# Patient Record
Sex: Male | Born: 2000
Health system: Southern US, Community
[De-identification: ages and names within clinical notes are randomized; demographics above are authoritative.]

## PROBLEM LIST (undated history)

## (undated) DIAGNOSIS — R000 Tachycardia, unspecified: Secondary | ICD-10-CM

## (undated) DIAGNOSIS — Z9889 Other specified postprocedural states: Secondary | ICD-10-CM

## (undated) DIAGNOSIS — F909 Attention-deficit hyperactivity disorder, unspecified type: Secondary | ICD-10-CM

## (undated) DIAGNOSIS — R112 Nausea with vomiting, unspecified: Secondary | ICD-10-CM

## (undated) DIAGNOSIS — F82 Specific developmental disorder of motor function: Secondary | ICD-10-CM

## (undated) DIAGNOSIS — M93003 Unspecified slipped upper femoral epiphysis (nontraumatic), unspecified hip: Secondary | ICD-10-CM

## (undated) DIAGNOSIS — F419 Anxiety disorder, unspecified: Secondary | ICD-10-CM

## (undated) DIAGNOSIS — I81 Portal vein thrombosis: Secondary | ICD-10-CM

## (undated) DIAGNOSIS — Q388 Other congenital malformations of pharynx: Secondary | ICD-10-CM

## (undated) DIAGNOSIS — D649 Anemia, unspecified: Secondary | ICD-10-CM

## (undated) DIAGNOSIS — F809 Developmental disorder of speech and language, unspecified: Secondary | ICD-10-CM

## (undated) DIAGNOSIS — J189 Pneumonia, unspecified organism: Secondary | ICD-10-CM

## (undated) DIAGNOSIS — U071 COVID-19: Secondary | ICD-10-CM

## (undated) DIAGNOSIS — E119 Type 2 diabetes mellitus without complications: Secondary | ICD-10-CM

## (undated) HISTORY — DX: Tachycardia, unspecified: R00.0

## (undated) HISTORY — PX: HIP SURGERY: SHX245

## (undated) HISTORY — DX: Attention-deficit hyperactivity disorder, unspecified type: F90.9

## (undated) HISTORY — DX: Other congenital malformations of pharynx: Q38.8

## (undated) HISTORY — PX: PHARYNGEAL FLAP REVISION: SHX2234

## (undated) HISTORY — DX: Specific developmental disorder of motor function: F82

## (undated) HISTORY — DX: Developmental disorder of speech and language, unspecified: F80.9

## (undated) HISTORY — PX: APPENDECTOMY: SHX54

## (undated) HISTORY — PX: PHARYNGEAL FLAP: SHX2233

---
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
## 2021-06-12 ENCOUNTER — Emergency Department (HOSPITAL_COMMUNITY): Payer: BC Managed Care – PPO

## 2021-06-12 ENCOUNTER — Encounter (HOSPITAL_COMMUNITY): Payer: Self-pay | Admitting: Emergency Medicine

## 2021-06-12 ENCOUNTER — Inpatient Hospital Stay (HOSPITAL_COMMUNITY)
Admission: EM | Admit: 2021-06-12 | Discharge: 2021-06-15 | DRG: 871 | Disposition: A | Discharge status: Home / Self Care | Payer: BC Managed Care – PPO | Attending: Family Medicine | Admitting: Family Medicine

## 2021-06-12 DIAGNOSIS — F82 Specific developmental disorder of motor function: Secondary | ICD-10-CM | POA: Diagnosis present

## 2021-06-12 DIAGNOSIS — E230 Hypopituitarism: Secondary | ICD-10-CM | POA: Diagnosis present

## 2021-06-12 DIAGNOSIS — K3532 Acute appendicitis with perforation and localized peritonitis, without abscess: Secondary | ICD-10-CM | POA: Diagnosis present

## 2021-06-12 DIAGNOSIS — I81 Portal vein thrombosis: Secondary | ICD-10-CM | POA: Diagnosis not present

## 2021-06-12 DIAGNOSIS — R000 Tachycardia, unspecified: Secondary | ICD-10-CM | POA: Diagnosis present

## 2021-06-12 DIAGNOSIS — E876 Hypokalemia: Secondary | ICD-10-CM | POA: Diagnosis not present

## 2021-06-12 DIAGNOSIS — Z888 Allergy status to other drugs, medicaments and biological substances status: Secondary | ICD-10-CM

## 2021-06-12 DIAGNOSIS — Z886 Allergy status to analgesic agent status: Secondary | ICD-10-CM

## 2021-06-12 DIAGNOSIS — Z20822 Contact with and (suspected) exposure to covid-19: Secondary | ICD-10-CM | POA: Diagnosis present

## 2021-06-12 DIAGNOSIS — R162 Hepatomegaly with splenomegaly, not elsewhere classified: Secondary | ICD-10-CM | POA: Diagnosis present

## 2021-06-12 DIAGNOSIS — K381 Appendicular concretions: Secondary | ICD-10-CM | POA: Diagnosis present

## 2021-06-12 DIAGNOSIS — R739 Hyperglycemia, unspecified: Secondary | ICD-10-CM | POA: Diagnosis present

## 2021-06-12 DIAGNOSIS — Z68.41 Body mass index (BMI) pediatric, greater than or equal to 95th percentile for age: Secondary | ICD-10-CM

## 2021-06-12 DIAGNOSIS — F909 Attention-deficit hyperactivity disorder, unspecified type: Secondary | ICD-10-CM | POA: Diagnosis present

## 2021-06-12 DIAGNOSIS — F809 Developmental disorder of speech and language, unspecified: Secondary | ICD-10-CM | POA: Diagnosis present

## 2021-06-12 DIAGNOSIS — A419 Sepsis, unspecified organism: Secondary | ICD-10-CM | POA: Diagnosis not present

## 2021-06-12 DIAGNOSIS — T368X5A Adverse effect of other systemic antibiotics, initial encounter: Secondary | ICD-10-CM | POA: Diagnosis not present

## 2021-06-12 DIAGNOSIS — D649 Anemia, unspecified: Secondary | ICD-10-CM | POA: Diagnosis not present

## 2021-06-12 DIAGNOSIS — Z8616 Personal history of COVID-19: Secondary | ICD-10-CM

## 2021-06-12 DIAGNOSIS — K760 Fatty (change of) liver, not elsewhere classified: Secondary | ICD-10-CM | POA: Diagnosis present

## 2021-06-12 DIAGNOSIS — Z79899 Other long term (current) drug therapy: Secondary | ICD-10-CM

## 2021-06-12 DIAGNOSIS — E669 Obesity, unspecified: Secondary | ICD-10-CM | POA: Diagnosis present

## 2021-06-12 LAB — URINALYSIS, ROUTINE W REFLEX MICROSCOPIC
Bacteria, UA: NONE SEEN
Bilirubin Urine: NEGATIVE
Glucose, UA: NEGATIVE mg/dL
Ketones, ur: NEGATIVE mg/dL
Leukocytes,Ua: NEGATIVE
Nitrite: NEGATIVE
Protein, ur: NEGATIVE mg/dL
Specific Gravity, Urine: 1.016 (ref 1.005–1.030)
pH: 5 (ref 5.0–8.0)

## 2021-06-12 LAB — D-DIMER, QUANTITATIVE: D-Dimer, Quant: 8.05 ug/mL-FEU — ABNORMAL HIGH (ref 0.00–0.50)

## 2021-06-12 LAB — CBC WITH DIFFERENTIAL/PLATELET
Abs Immature Granulocytes: 0 10*3/uL (ref 0.00–0.07)
Basophils Absolute: 0 10*3/uL (ref 0.0–0.1)
Basophils Relative: 0 %
Eosinophils Absolute: 0 10*3/uL (ref 0.0–0.5)
Eosinophils Relative: 0 %
HCT: 35.2 % — ABNORMAL LOW (ref 39.0–52.0)
Hemoglobin: 11 g/dL — ABNORMAL LOW (ref 13.0–17.0)
Lymphocytes Relative: 8 %
Lymphs Abs: 1.2 10*3/uL (ref 0.7–4.0)
MCH: 26.6 pg (ref 26.0–34.0)
MCHC: 31.3 g/dL (ref 30.0–36.0)
MCV: 85.2 fL (ref 80.0–100.0)
Monocytes Absolute: 0.6 10*3/uL (ref 0.1–1.0)
Monocytes Relative: 4 %
Neutro Abs: 12.8 10*3/uL — ABNORMAL HIGH (ref 1.7–7.7)
Neutrophils Relative %: 88 %
Platelets: 199 10*3/uL (ref 150–400)
RBC: 4.13 MIL/uL — ABNORMAL LOW (ref 4.22–5.81)
RDW: 14.6 % (ref 11.5–15.5)
WBC: 14.6 10*3/uL — ABNORMAL HIGH (ref 4.0–10.5)
nRBC: 0 % (ref 0.0–0.2)
nRBC: 0 /100 WBC

## 2021-06-12 LAB — COMPREHENSIVE METABOLIC PANEL
ALT: 114 U/L — ABNORMAL HIGH (ref 0–44)
AST: 74 U/L — ABNORMAL HIGH (ref 15–41)
Albumin: 2.3 g/dL — ABNORMAL LOW (ref 3.5–5.0)
Alkaline Phosphatase: 132 U/L — ABNORMAL HIGH (ref 38–126)
Anion gap: 7 (ref 5–15)
BUN: 10 mg/dL (ref 6–20)
CO2: 22 mmol/L (ref 22–32)
Calcium: 7.6 mg/dL — ABNORMAL LOW (ref 8.9–10.3)
Chloride: 112 mmol/L — ABNORMAL HIGH (ref 98–111)
Creatinine, Ser: 0.69 mg/dL (ref 0.61–1.24)
GFR, Estimated: >60 mL/min (ref >60)
Glucose, Bld: 128 mg/dL — ABNORMAL HIGH (ref 70–99)
Potassium: 3.2 mmol/L — ABNORMAL LOW (ref 3.5–5.1)
Sodium: 141 mmol/L (ref 135–145)
Total Bilirubin: 0.9 mg/dL (ref 0.3–1.2)
Total Protein: 5.3 g/dL — ABNORMAL LOW (ref 6.5–8.1)

## 2021-06-12 LAB — RESP PANEL BY RT-PCR (FLU A&B, COVID) ARPGX2
Influenza A by PCR: NEGATIVE
Influenza B by PCR: NEGATIVE
SARS Coronavirus 2 by RT PCR: NEGATIVE

## 2021-06-12 LAB — LACTIC ACID, PLASMA
Lactic Acid, Venous: 2.5 mmol/L (ref 0.5–1.9)
Lactic Acid, Venous: 1.5 mmol/L (ref 0.5–1.9)

## 2021-06-12 LAB — TSH: TSH: 1.977 u[IU]/mL (ref 0.350–4.500)

## 2021-06-12 MED ORDER — POTASSIUM CHLORIDE 20 MEQ PO PACK
40.0000 meq | PACK | Freq: Once | ORAL | Status: AC
  Administered 2021-06-12: 40 meq via ORAL
  Filled 2021-06-12: qty 2

## 2021-06-12 MED ORDER — IOHEXOL 350 MG/ML SOLN
100.0000 mL | Freq: Once | INTRAVENOUS | Status: AC | PRN
  Administered 2021-06-12: 100 mL via INTRAVENOUS

## 2021-06-12 MED ORDER — METRONIDAZOLE 500 MG/100ML IV SOLN
500.0000 mg | Freq: Three times a day (TID) | INTRAVENOUS | Status: DC
  Administered 2021-06-12 – 2021-06-14 (×5): 500 mg via INTRAVENOUS
  Filled 2021-06-12 (×5): qty 100

## 2021-06-12 MED ORDER — HEPARIN BOLUS VIA INFUSION
3000.0000 [IU] | Freq: Once | INTRAVENOUS | Status: AC
  Administered 2021-06-12: 3000 [IU] via INTRAVENOUS
  Filled 2021-06-12: qty 3000

## 2021-06-12 MED ORDER — LACTATED RINGERS IV BOLUS
2000.0000 mL | Freq: Once | INTRAVENOUS | Status: AC
  Administered 2021-06-12: 16:00:00 2000 mL via INTRAVENOUS

## 2021-06-12 MED ORDER — HEPARIN (PORCINE) 25000 UT/250ML-% IV SOLN
2200.0000 [IU]/h | INTRAVENOUS | Status: DC
  Administered 2021-06-12: 1300 [IU]/h via INTRAVENOUS
  Administered 2021-06-13: 1500 [IU]/h via INTRAVENOUS
  Administered 2021-06-14: 2000 [IU]/h via INTRAVENOUS
  Filled 2021-06-12 (×3): qty 250

## 2021-06-12 MED ORDER — LACTATED RINGERS IV SOLN: INTRAVENOUS | Status: DC

## 2021-06-12 MED ORDER — VANCOMYCIN HCL 10 G IV SOLR
2000.0000 mg | Freq: Once | INTRAVENOUS | Status: AC
  Administered 2021-06-12: 2000 mg via INTRAVENOUS
  Filled 2021-06-12 (×2): qty 2000

## 2021-06-12 MED ORDER — SODIUM CHLORIDE 0.9 % IV SOLN
2.0000 g | INTRAVENOUS | Status: DC
  Administered 2021-06-13 – 2021-06-14 (×2): 2 g via INTRAVENOUS
  Filled 2021-06-12: qty 2
  Filled 2021-06-12: qty 20

## 2021-06-12 MED ORDER — PIPERACILLIN-TAZOBACTAM 3.375 G IVPB 30 MIN
3.3750 g | Freq: Once | INTRAVENOUS | Status: AC
  Administered 2021-06-12: 3.375 g via INTRAVENOUS
  Filled 2021-06-12: qty 50

## 2021-06-12 MED ORDER — PIPERACILLIN-TAZOBACTAM 3.375 G IVPB: 3.3750 g | Freq: Three times a day (TID) | INTRAVENOUS | Status: DC

## 2021-06-12 NOTE — Progress Notes (Addendum)
ANTICOAGULATION CONSULT NOTE - Initial Consult  Pharmacy Consult for Heparin  Indication: Non-occlusive portal vein thrombosis   Allergies  Allergen Reactions   Vancomycin Swelling    Lip swelling, red and puffy, burning in groin/genital region   Guaifenesin & Derivatives Hives    Reaction was to either guaifenesin or dayquil   Lomotil [Diphenoxylate-Atropine] Hives    Possible reaction to Lomotil - 07/25/2020   Chlorhexidine Rash   Dayquil [Pseudoephedrine-Apap-Dm] Hives    Reaction was to either guaifenesin or dayquil    Tylenol [Acetaminophen] Hives    Patient Measurements: Height: 5\' 9"  (175.3 cm) Weight: 106.2 kg (234 lb 2.1 oz) IBW/kg (Calculated) : 70.7  Vital Signs: Temp: 98.6 F (37 C) (07/01 2244) Temp Source: Oral (07/01 2244) BP: 128/86 (07/01 2244) Pulse Rate: 91 (07/01 2244)  Labs: Recent Labs    06/12/21 1457  HGB 11.0*  HCT 35.2*  PLT 199  CREATININE 0.69    Estimated Creatinine Clearance: 178.3 mL/min (by C-G formula based on SCr of 0.69 mg/dL).   Medical History: Past Medical History:  Diagnosis Date   ADHD (attention deficit hyperactivity disorder)    Gross motor development delay    PONV (postoperative nausea and vomiting)    Speech delay    Velopharyngeal insufficiency, congenital     Assessment: 20 y/o M who presented to the ED with 2 weeks of intermittent N/V, chills, and tachycardia. Found to have perforated appendicitis. Also noted to have elevated D-dimer and Non-occlusive portal vein thrombosis. Starting heparin per pharmacy. Hgb 11. Renal function good.   Goal of Therapy:  Heparin level 0.3-0.7 units/ml Monitor platelets by anticoagulation protocol: Yes   Plan:  Heparin 3000 units BOLUS (reduced bolus with perforated appendix) Start heparin drip at 1300 units/hr 0800 Heparin level Daily CBC/Heparin level Monitor for bleeding  Narda Bonds, PharmD, BCPS Clinical Pharmacist Phone: [phone]

## 2021-06-12 NOTE — ED Provider Notes (Addendum)
I received this patient in signout from Dr. Zenia Resides.  Briefly, he had presented with 2 weeks of intermittent fevers, initially had nausea, vomiting, and diarrhea but no associated abdominal pain.  EMS noted severe tachycardia and initially gave adenosine but patient later found to have sinus tachycardia likely due to fever.  At time of signout, fluids had been ordered and awaiting lab work.  Labs show initial WBC 14.6, lactate 2.5, hemoglobin 11, negative COVID-19, normal creatinine, AST 74, ALT 114, normal bilirubin, D-dimer 8.  Based on elevated lactate and WBC count, gave broad-spectrum antibiotics including vancomycin and Zosyn for unknown source.  Obtain CTA of chest to evaluate for PE and also obtain CT of abdomen pelvis to evaluate for source of infection.  CTA negative for PE but CT of abdomen shows likely perforated appendicitis with extraluminal air and fluid with surrounding inflammatory changes.  He also has nonocclusive thrombus in portal vein likely secondary to inflammation.  Findings discussed with radiologist.  Consulted general surgery and discussed with Dr. Rosendo Gros who evaluated pt, has recommended abx for now. Will be delayed surgical intervention. Recommended medicine admission due to portal vein thrombus and potential need for anticoagulation.  Discussed admission with family medicine teaching service.  CRITICAL CARE Performed by: Wenda Overland [place]   Total critical care time: 45 minutes  Critical care time was exclusive of separately billable procedures and treating other patients.  Critical care was necessary to treat or prevent imminent or life-threatening deterioration.  Critical care was time spent personally by me on the following activities: development of treatment plan with patient and/or surrogate as well as nursing, discussions with consultants, evaluation of patient's response to treatment, examination of patient, obtaining history from patient or surrogate,  ordering and performing treatments and interventions, ordering and review of laboratory studies, ordering and review of radiographic studies, pulse oximetry and re-evaluation of patient's condition.    Marietta Sikkema, Wenda Overland, MD 06/12/21 1954    Rex Kras, Wenda Overland, MD 06/12/21 2212

## 2021-06-12 NOTE — ED Provider Notes (Signed)
[facility] Provider Note   CSN: [id number] Arrival date & time: 06/12/21  1431     History Chief Complaint  Patient presents with   Tachycardia    Juan Valencia is a 20 y.o. male.  20 year old male who presents with 2 weeks of fever off and on without definitive source.  Does have a history of COVID-pneumonia in the past.  Last had increased temperature 2 days ago and was seen by his pediatrician the day before that.  Had negative COVID and mono at that time.  States that he has been more short of breath.  Patient did have a white count of 14,000.  He had an elevated sed rate as well 2.  While at home today, patient had increased weakness.  Was found to have a heart rate in the 170s.  Temperature was 103.  He took Motrin for this.  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   Obesity peds (BMI >=95 percentile) 04/10/2015   Delayed bone age 10/26/2014   Short stature 05/02/2012   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   Velopharyngeal insufficiency, congenital    ADHD (attention deficit hyperactivity disorder)    Speech delay    Gross motor development delay     Past Surgical History:  Procedure Laterality Date   HARDWARE REMOVAL Bilateral 07/26/2019   Procedure: removal of hardware bilateral hips;  Surgeon: Meredith Pel, MD;  Location: [facility];  Service: Orthopedics;  Laterality: Bilateral;   HIP PINNING,CANNULATED Bilateral 05/26/2017   Procedure: BILATERAL CANNULATED HIP PINNING;  Surgeon: Meredith Pel, MD;  Location: [facility];  Service: Orthopedics;  Laterality: Bilateral;   PHARYNGEAL FLAP     PHARYNGEAL FLAP REVISION         Family History  Problem Relation Age of Onset   Obesity Mother    Tall stature Father    Hypertension Father    Obesity Maternal Grandmother    Hypertension Maternal Grandmother    Diabetes Maternal Grandmother    Heart disease Paternal Grandmother    Diabetes Paternal Grandmother    Cancer Paternal Grandfather        lung    Social History   Tobacco Use   Smoking status: Never   Smokeless tobacco: Never  Vaping Use   Vaping Use: Never used  Substance Use Topics   Alcohol use: No   Drug use: No    Home Medications Prior to Admission medications   Medication Sig Start Date End Date Taking? Authorizing Provider  albuterol (VENTOLIN HFA) 108 (90 Base) MCG/ACT inhaler Inhale 2 puffs into the lungs every 6 (six) hours as needed for wheezing or shortness of breath. 07/30/20   Thurnell Lose, MD  bismuth subsalicylate (PEPTO BISMOL) 262 MG/15ML suspension Take 30 mLs by mouth every 6 (six) hours  as needed for diarrhea or loose stools.    [provider]    Allergies    Guaifenesin & derivatives, Lomotil [diphenoxylate-atropine], Dayquil [pseudoephedrine-apap-dm], and Tylenol [acetaminophen]  Review of Systems   Review of Systems  All other systems reviewed and are negative.  Physical Exam Updated Vital Signs BP 98/64 (BP Location: Right Arm)   Pulse (!) 167   Temp 99.3 F (37.4 C) (Oral)   Resp 16   SpO2 96%   Physical Exam Vitals and nursing note reviewed.  Constitutional:      General: He is not in acute distress.    Appearance: Normal appearance. He is well-developed. He is not toxic-appearing.  HENT:     Head:  Normocephalic and atraumatic.  Eyes:     General: Lids are normal.     Conjunctiva/sclera: Conjunctivae normal.     Pupils: Pupils are equal, round, and reactive to light.  Neck:     Thyroid: No thyroid mass.     Trachea: No tracheal deviation.  Cardiovascular:     Rate and Rhythm: Regular rhythm. Tachycardia present.     Heart sounds: Normal heart sounds. No murmur heard.   No gallop.  Pulmonary:     Effort: Pulmonary effort is normal. No respiratory distress.     Breath sounds: Normal breath sounds. No stridor. No decreased breath sounds, wheezing, rhonchi or rales.  Abdominal:     General: There is no distension.     Palpations: Abdomen is soft.     Tenderness: There is no abdominal tenderness. There is no rebound.  Musculoskeletal:        General: No tenderness. Normal range of motion.     Cervical back: Normal range of motion and neck supple.  Skin:    General: Skin is warm and dry.     Findings: No abrasion or rash.  Neurological:     Mental Status: He is alert and oriented to person, place, and time. Mental status is at baseline.     GCS: GCS eye subscore is 4. GCS verbal subscore is 5. GCS motor subscore is 6.     Cranial Nerves: Cranial nerves are intact. No cranial nerve deficit.     Sensory: No sensory deficit.     Motor: Motor function is intact.  Psychiatric:        Attention and Perception: Attention normal.        Speech: Speech normal.        Behavior: Behavior normal.    ED Results / Procedures / Treatments   Labs (all labs ordered are listed, but only abnormal results are displayed) Labs Reviewed  CULTURE, BLOOD (ROUTINE X 2)  CULTURE, BLOOD (ROUTINE X 2)  RESP PANEL BY RT-PCR (FLU A&B, COVID) ARPGX2  CBC WITH DIFFERENTIAL/PLATELET  COMPREHENSIVE METABOLIC PANEL  D-DIMER, QUANTITATIVE  LACTIC ACID, PLASMA  TSH    EKG EKG Interpretation  Date/Time:  Friday June 12 2021 14:38:24 EDT Ventricular Rate:  167 PR Interval:  110 QRS Duration: 88 QT  Interval:  314 QTC Calculation: 524 R Axis:   98 Text Interpretation: Sinus tachycardia Consider RVH w/ secondary repol abnormality Prolonged QT interval Confirmed by Lacretia Leigh ([id number]) on 06/12/2021 2:50:10 PM  Radiology No results found.  Procedures Procedures   Medications Ordered in ED Medications  lactated ringers bolus 2,000 mL (has no administration in time range)  lactated ringers infusion (has no administration in time range)    ED Course  I have reviewed the triage vital  signs and the nursing notes.  Pertinent labs & imaging results that were available during my care of the patient were reviewed by me and considered in my medical decision making (see chart for details).    MDM Rules/Calculators/A&P                         Patient appears to be in sinus tach at this time.  Will give 2 L of fluid and initiate septic work-up.  Labs chest x-ray pending.  Will sign out to Dr. Rex Kras Final Clinical Impression(s) / ED Diagnoses Final diagnoses:  None    Rx / DC Orders ED Discharge Orders     None        Lacretia Leigh, MD 06/12/21 1457

## 2021-06-12 NOTE — H&P (Addendum)
Juan Valencia Admission History and Physical Service Pager: [phone]  Patient name: Juan Valencia Medical record number: [id number] Date of birth: Nov 14, 2001 Age: 20 y.o. Gender: male  Primary Care Provider: Harrie Jeans, MD Consultants: General Surgery  Code Status: Full Code   Preferred Emergency Contact: Mylo Red, Mother, [phone]  Chief Complaint: emesis and fevers x2 weeks  Assessment and Plan: Juan Valencia is a 20 y.o. male presenting with vomiting found to have portal vein thrombosis and perforated appendicitis. PMH is significant for ADHD.   Perforated Appendicitis w/ calcification Patient presented after 2 weeks of intermittent fever, diarrhea, vomiting.  CT of his abdomen and pelvis showed perforated appendicitis with appendicolith.  Of note, patient has no tenderness on abdominal exam, no signs of peritoneal irritation.  White blood cell count elevated at 14.6.  Patient afebrile at the time of admission to ED.  Patient treated with 1 dose of vancomycin and 1 dose of Zosyn in ED.  Patient began to have symptoms of erythematous rash, lip edema, growing burning sensation following dose of vancomycin.  Patient's mother request that he no longer receive this medication and asked that he has multiple allergies to other antibiotics.  Patient requires admission for meeting sepsis criteria given his tachycardia, fever, leukocytosis and initial elevated lactic acid of 2.5.  Will admit to [facility] with antibiotic therapy. -Admit to MedSurg, observation, attending Dr. Owens Shark -Patient evaluated by general surgery with recommendations for medical management with antibiotics, no surgical intervention at this time -Appreciate surgical recommendations, expect for patient to follow-up in 6-8 weeks with general surgery as recommended by Dr. Rosendo Gros -DC vancomycin and Zosyn -7/2, patient to start ceftriaxone and metronidazole IV -Monitor fever curve -Follow-up blood  and urine cultures -Continue lactated Ringer's infusion at 125 mL/h -Vital signs per floor protocol  Portal Vein Thrombosis  Finding on abdominal and pelvic CT thought to be related to inflammation from perforated appendicitis. Risk factors include obesity, sepsis, and concurrent appendicitis. Will further evaluate LFTs to assess for any hepatic dysfunction as cirrhosis also listed as risk factor (low suspicion for this given age and social hx). CTA of chest was negative for PE.  - LFTs  - liver doppler  - heparin pharmacy consult  - monitor Hgb with CBC daily  - plan to transition to PO AC (consider 3 month duration for oral AC)  - AM consult w/ heme-onc for recommendations for duration    Sinus Tachycardia Patient's mother denies any history of arrhythmia other than tachycardia during patient's COVID-19 infection.  Patient reported to have sinus tachycardia upon EKG on admission to ED.  Patient is status post 2 doses of adenosine in the field prior to presenting to the ED.  At time of admission exam, patient heart rate ranging from 70s to 80s and regular.  Tachycardia likely related to patient's fever, likely hypovolemia in the setting of prolonged diarrhea and vomiting as well as fever with report of fever of 130 prior to calling EMS. CT chest negative for PE.  -Cardiac monitoring -A.m. EKG  History of  COVID-19 Patient reported to have hospitalization in 2021 due to COVID-19 infection and hypoxia into the 60s as well as tachycardia into the 150s and a D-dimer elevated greater than 20.  CTA chest findings may be residual result of this history of infection.  Of note, patient stable with normal work of breathing on room air during admission. -Monitor respiratory status for any declining oxygen saturation -Monitor pulse oximetry with vitals  check  Hypokalemia Patient presented with potassium of 3.2.  Likely some component of decreased p.o. intake in the last few days with GI losses from  vomiting and diarrhea.  Patient reports increasing appetite and has not had emesis in 4 days.   -Daily BMP -Treat w/ 40 mill equivalents of potassium powder packet, repeat dose 7/2 AM  Hypocalcemia Patient presented with hypocalcemia level of 7.6, corrected to 8.6 when accounting for low albumin of 2.3. -Monitor electrolytes and replenish as necessary   FEN/GI: reg diet  Prophylaxis: Hep  Disposition: med tele  History of Present Illness:  Juan Valencia is a 20 y.o. male presenting with 2 week of fevers and vomiting found to have perforated appendicitis and non-occlusive portal vein thrombus.   Pt tried melatonin to help with sleeping. Mother reports he had elevated HR to 122 the following day. On Saturday, he reported some abdominal cramping and was able to eat dinner and thought he was having some residual effects of trying the new melatonin. Sunday: he started having emesis. He was not able to take anything PO even vomiting water at times. Continued to have abdominal cramping and emesis. Fevers started a few days after the nausea and emesis. On the fourth day, he was able to tolerate the BRAT diet and still having normal urine output then diarrhea started. He then started to have improved symptoms for a few days but then relapsed and started to experience chills. They checked his temp at the time and it was  97 degrees. He then began to have alternating fevers with chills followed by normal temps. He went to physician on 6/28 and was thought to have viral gastroenteritis. His max temp was 104.4 Wed 6/29. They were notified of negative covid test, elevated SED rate, WBC 14. This afternoon, patient's HR was up to 180, he was pale and temp was 103.6. They called EMS and HR was up to 204 and BP was 95, became diaphoretic. EMS gave two doses of adenosine and HR dropped and then bounced back to 180. Patient has denied abdominal pain with today's episode but did report some gas like pain but has been  able to eat and drink for the last 4 days. He had breakfast this AM that included oatmeal and OJ as well as a 32oz container of water.  After the Vancomycin was administered, he started to turn red, had swelling of his lips & burning in his groin area. Denies any respiratory symptoms at this time so the vanc infusion was paused.    ED Course:  Upon arrival to ED, patient was afebrile but noted to have tachycardia to max heart rate of 167.  EKG showed sinus tachycardia.  Blood pressure was as low as 98/64.  Patient maintaining appropriate oxygen saturations between 96 and 100%.  He was given 2 lactated ringer boluses and started on maintenance IV fluids with lactated Ringer's.  CT abdomen and pelvis showed perforated appendicitis as well as portal vein thrombosis.  Chest CTA was negative for PE but did show heterogenous air trapping and small airway disease. Patient was also given 1 dose of vancomycin along with Zosyn.  General surgery was consulted and recommended medical therapy and admission for treatment of portal vein thrombosis.    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  Denies Tobacco use, never smoker   Denies Alcohol consumption    Surgery Hx:  Phalengeal flap surgeries  Bilateral hip surgeries for [facility]    All: chlorhexadine makes him itch  now reaction to Vanc  Review Of Systems: Per HPI with the following additions:   Review of Systems  Constitutional:  Positive for chills, diaphoresis and fever. Negative for appetite change.  HENT:  Negative for congestion, rhinorrhea and sore throat.   Respiratory:  Negative for cough and shortness of breath.   Cardiovascular:  Negative for chest pain and palpitations.  Gastrointestinal:  Negative for abdominal pain, diarrhea, nausea and vomiting.  Genitourinary:  Negative for decreased urine volume and hematuria.  Musculoskeletal:  Negative for back pain, myalgias and neck pain.  Skin:   Negative for rash.  Neurological:  Negative for light-headedness and headaches.    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   Velopharyngeal insufficiency, congenital    ADHD (attention deficit hyperactivity disorder)    Speech delay    Gross motor development delay     Past Medical History: Past Medical History:  Diagnosis Date   ADHD (attention deficit hyperactivity disorder)    Gross motor development delay    PONV (postoperative nausea and vomiting)    Speech delay    Velopharyngeal insufficiency, congenital     Past Surgical History: Past Surgical History:  Procedure Laterality Date   HARDWARE REMOVAL Bilateral 07/26/2019   Procedure: removal of hardware bilateral hips;  Surgeon: Meredith Pel, MD;  Location: [facility];  Service: Orthopedics;  Laterality: Bilateral;   HIP PINNING,CANNULATED Bilateral 05/26/2017   Procedure: BILATERAL CANNULATED HIP PINNING;  Surgeon: Meredith Pel, MD;  Location: [facility];  Service: Orthopedics;  Laterality: Bilateral;   PHARYNGEAL FLAP     PHARYNGEAL FLAP REVISION      Social History: Social History   Tobacco Use   Smoking status: Never   Smokeless tobacco: Never  Vaping Use   Vaping Use: Never used  Substance Use Topics   Alcohol use: No   Drug use: No    Family History: Family History  Problem Relation Age of Onset   Obesity Mother    Tall stature Father    Hypertension Father    Obesity Maternal Grandmother     Hypertension Maternal Grandmother    Diabetes Maternal Grandmother    Heart disease Paternal Grandmother    Diabetes Paternal Grandmother    Cancer Paternal Grandfather        lung    Allergies and Medications: Allergies  Allergen Reactions   Vancomycin Swelling    Lip swelling, red and puffy, burning in groin/genital region   Guaifenesin & Derivatives Hives    Reaction was to either guaifenesin or dayquil   Lomotil [Diphenoxylate-Atropine] Hives    Possible reaction to Lomotil - 07/25/2020   Chlorhexidine Rash   Dayquil [Pseudoephedrine-Apap-Dm] Hives    Reaction was to either guaifenesin or dayquil    Tylenol [Acetaminophen] Hives   No current facility-administered medications on file prior to encounter.   Current Outpatient Medications on File Prior to Encounter  Medication Sig Dispense Refill  albuterol (VENTOLIN HFA) 108 (90 Base) MCG/ACT inhaler Inhale 2 puffs into the lungs every 6 (six) hours as needed for wheezing or shortness of breath. 6.7 g 0   ibuprofen (ADVIL) 200 MG tablet Take 600 mg by mouth every 6 (six) hours as needed for fever.      Objective: BP 128/86   Pulse 91   Temp 98.6 F (37 C) (Oral)   Resp 19   Ht 5\' 9"  (1.753 m)   Wt 106.2 kg   SpO2 100%   BMI 34.57 kg/m   Exam: General: Obese male, lying supine in bed, no acute distress, conversant Eyes: No scleral icterus, some conjunctival injection, right greater than left ENTM: Moist mucous membranes Neck: Normal range of motion Cardiovascular: Regular rate and rhythm, normal S1 and S2, no murmurs appreciated, bilateral radial pulses palpable Respiratory: Normal work of breathing, stable on room air, no crackles, no wheezing, no prolonged expiratory phase Gastrointestinal: no tenderness to palpation, mildly decreased bowel sounds, soft, non distend MSK: moves all extremities with normal ROM  Derm: no rashes or lacerations, no wounds  Neuro: PERRLA, EOMI, some conjunctival injection bilaterally  (patient reports common when he is tired), 5/5 strength in bilateral upper and lower extremities   Labs and Imaging: CBC BMET  Recent Labs  Lab 06/12/21 1457  WBC 14.6*  HGB 11.0*  HCT 35.2*  PLT 199   Recent Labs  Lab 06/12/21 1457  NA 141  K 3.2*  CL 112*  CO2 22  BUN 10  CREATININE 0.69  GLUCOSE 128*  CALCIUM 7.6*     EKG: Sinus tachycardia, heart rate 167 on admission, QTC prolonged 524  Simmons-Robinson, Riki Sheer, MD 06/13/2021, 4:39 AM PGY-3, [facility] pager: [phone], text pages welcome

## 2021-06-12 NOTE — ED Triage Notes (Signed)
Patient BIB [facility] from home for feeling like his heart was racing. En route, HR 190-200. Given 1034mL NS, 6mg  and 12mg  adenosine, HR decreased momentarily to 170-200. HR 170 on arrival to ED. Patient alert and oriented. Denies chest pain, denies shortness of breath. Patient reports similar incident when he had COVID in August.

## 2021-06-12 NOTE — Consult Note (Signed)
Reason for Consult: Perforated appendicitis Referring Physician: Dr. Shonna Chock is an 20 y.o. male.  HPI: Patient is a 20 year old male, who comes in secondary to a history of intermittent nausea vomiting, fevers, chills, and tachycardia. Patient states that his nausea and vomiting began approximately 2 weeks ago.  He states that he had intermittent fevers and chills.  Patient states that his nausea vomit eventually resolved.  Patient describes no abdominal pain throughout the last 2 weeks.  Secondary to tachycardia today patient was brought to the ER for further evaluation.  Upon evaluation the ER patient underwent CT scan.  Patient was found to have what appears to be perforated appendicitis, with appendicolith.  Patient also with a nonocclusive portal vein thrombus.  Patient did have a leukocytosis and elevated D-dimer.  I did review the CT scan and laboratory studies personally.  General surgery was consulted for further evaluation.  Past Medical History:  Diagnosis Date   ADHD (attention deficit hyperactivity disorder)    Gross motor development delay    PONV (postoperative nausea and vomiting)    Speech delay    Velopharyngeal insufficiency, congenital     Past Surgical History:  Procedure Laterality Date   HARDWARE REMOVAL Bilateral 07/26/2019   Procedure: removal of hardware bilateral hips;  Surgeon: Meredith Pel, MD;  Location: [facility];  Service: Orthopedics;  Laterality: Bilateral;   HIP PINNING,CANNULATED Bilateral 05/26/2017   Procedure: BILATERAL CANNULATED HIP PINNING;  Surgeon: Meredith Pel, MD;  Location: [facility];  Service: Orthopedics;  Laterality: Bilateral;   PHARYNGEAL FLAP     PHARYNGEAL FLAP REVISION      Family History  Problem Relation Age of Onset   Obesity Mother    Tall stature Father    Hypertension Father    Obesity Maternal Grandmother    Hypertension Maternal Grandmother    Diabetes Maternal Grandmother    Heart disease Paternal  Grandmother    Diabetes Paternal Grandmother    Cancer Paternal Grandfather        lung    Social History:  reports that he has never smoked. He has never used smokeless tobacco. He reports that he does not drink alcohol and does not use drugs.  Allergies:  Allergies  Allergen Reactions   Guaifenesin & Derivatives Hives    Reaction was to either guaifenesin or dayquil   Lomotil [Diphenoxylate-Atropine] Hives    Possible reaction to Lomotil - 07/25/2020   Chlorhexidine Rash   Dayquil [Pseudoephedrine-Apap-Dm] Hives    Reaction was to either guaifenesin or dayquil    Tylenol [Acetaminophen] Hives    Medications: I have reviewed the patient's current medications.  Results for orders placed or performed during the hospital encounter of 06/12/21 (from the past 48 hour(s))  Lactic acid, plasma     Status: Abnormal   Collection Time: 06/12/21  2:49 PM  Result Value Ref Range   Lactic Acid, Venous 2.5 (HH) 0.5 - 1.9 mmol/L    Comment: CRITICAL RESULT CALLED TO, READ BACK BY AND VERIFIED WITH:  Shelda Altes, RN, [phone], 06/12/21, ADEDOKUNE Performed at [facility], [place] [address] [place], [place] [address]   TSH     Status: None   Collection Time: 06/12/21  2:49 PM  Result Value Ref Range   TSH 1.977 0.350 - 4.500 uIU/mL    Comment: Performed by a 3rd Generation assay with a functional sensitivity of <=0.01 uIU/mL. Performed at [facility], [place] [address] [place], [place]  [address]   CBC with Differential/Platelet     Status: Abnormal   Collection Time: 06/12/21  2:57 PM  Result Value Ref Range   WBC 14.6 (H) 4.0 - 10.5 K/uL   RBC 4.13 (L) 4.22 - 5.81 MIL/uL   Hemoglobin 11.0 (L) 13.0 - 17.0 g/dL   HCT 35.2 (L) 39.0 - 52.0 %   MCV 85.2 80.0 - 100.0 fL   MCH 26.6 26.0 - 34.0 pg   MCHC 31.3 30.0 - 36.0 g/dL   RDW 14.6 11.5 - 15.5 %   Platelets 199 150 - 400 K/uL   nRBC 0.0 0.0 - 0.2 %   Neutrophils Relative % 88 %   Neutro Abs 12.8 (H) 1.7 - 7.7 K/uL    Lymphocytes Relative 8 %   Lymphs Abs 1.2 0.7 - 4.0 K/uL   Monocytes Relative 4 %   Monocytes Absolute 0.6 0.1 - 1.0 K/uL   Eosinophils Relative 0 %   Eosinophils Absolute 0.0 0.0 - 0.5 K/uL   Basophils Relative 0 %   Basophils Absolute 0.0 0.0 - 0.1 K/uL   nRBC 0 0 /100 WBC   Abs Immature Granulocytes 0.00 0.00 - 0.07 K/uL    Comment: Performed at [facility], [address], [place], [place] [address]  Comprehensive metabolic panel     Status: Abnormal   Collection Time: 06/12/21  2:57 PM  Result Value Ref Range   Sodium 141 135 - 145 mmol/L   Potassium 3.2 (L) 3.5 - 5.1 mmol/L   Chloride 112 (H) 98 - 111 mmol/L   CO2 22 22 - 32 mmol/L   Glucose, Bld 128 (H) 70 - 99 mg/dL    Comment: Glucose reference range applies only to samples taken after fasting for at least 8 hours.   BUN 10 6 - 20 mg/dL   Creatinine, Ser 0.69 0.61 - 1.24 mg/dL   Calcium 7.6 (L) 8.9 - 10.3 mg/dL   Total Protein 5.3 (L) 6.5 - 8.1 g/dL   Albumin 2.3 (L) 3.5 - 5.0 g/dL   AST 74 (H) 15 - 41 U/L   ALT 114 (H) 0 - 44 U/L   Alkaline Phosphatase 132 (H) 38 - 126 U/L   Total Bilirubin 0.9 0.3 - 1.2 mg/dL   GFR, Estimated >60 >60 mL/min    Comment: (NOTE) Calculated using the CKD-EPI Creatinine Equation (2021)    Anion gap 7 5 - 15    Comment: Performed at [facility], [place] [address], [place], [place] [address]  D-dimer, quantitative     Status: Abnormal   Collection Time: 06/12/21  2:57 PM  Result Value Ref Range   D-Dimer, Quant 8.05 (H) 0.00 - 0.50 ug/mL-FEU    Comment: (NOTE) At the manufacturer cut-off value of 0.5 g/mL FEU, this assay has a negative predictive value of 95-100%.This assay is intended for use in conjunction with a clinical pretest probability (PTP) assessment model to exclude pulmonary embolism (PE) and deep venous thrombosis (DVT) in outpatients suspected of PE or DVT. Results should be correlated with clinical presentation. Performed at [facility], [place]  [address] [place], [place] [address]   Urinalysis, Routine w reflex microscopic Urine, Clean Catch     Status: Abnormal   Collection Time: 06/12/21  3:01 PM  Result Value Ref Range   Color, Urine YELLOW YELLOW   APPearance CLEAR CLEAR   Specific Gravity, Urine 1.016 1.005 - 1.030   pH 5.0 5.0 - 8.0  Glucose, UA NEGATIVE NEGATIVE mg/dL   Hgb urine dipstick SMALL (A) NEGATIVE   Bilirubin Urine NEGATIVE NEGATIVE   Ketones, ur NEGATIVE NEGATIVE mg/dL   Protein, ur NEGATIVE NEGATIVE mg/dL   Nitrite NEGATIVE NEGATIVE   Leukocytes,Ua NEGATIVE NEGATIVE   RBC / HPF 0-5 0 - 5 RBC/hpf   WBC, UA 0-5 0 - 5 WBC/hpf   Bacteria, UA NONE SEEN NONE SEEN   Mucus PRESENT     Comment: Performed at [facility] [address] [place], [place] [address]  Resp Panel by RT-PCR (Flu A&B, Covid) Nasopharyngeal Swab     Status: None   Collection Time: 06/12/21  3:39 PM   Specimen: Nasopharyngeal Swab; Nasopharyngeal(NP) swabs in vial transport medium  Result Value Ref Range   SARS Coronavirus 2 by RT PCR NEGATIVE NEGATIVE    Comment: (NOTE) SARS-CoV-2 target nucleic acids are NOT DETECTED.  The SARS-CoV-2 RNA is generally detectable in upper respiratory specimens during the acute phase of infection. The lowest concentration of SARS-CoV-2 viral copies this assay can detect is 138 copies/mL. A negative result does not preclude SARS-Cov-2 infection and should not be used as the sole basis for treatment or other patient management decisions. A negative result may occur with  improper specimen collection/handling, submission of specimen other than nasopharyngeal swab, presence of viral mutation(s) within the areas targeted by this assay, and inadequate number of viral copies(<138 copies/mL). A negative result must be combined with clinical observations, patient history, and epidemiological information. The expected result is Negative.  Fact Sheet for Patients:   EntrepreneurPulse.com.au  Fact Sheet for Healthcare Providers:  IncredibleEmployment.be  This test is no t yet approved or cleared by the [place] FDA and  has been authorized for detection and/or diagnosis of SARS-CoV-2 by FDA under an Emergency Use Authorization (EUA). This EUA will remain  in effect (meaning this test can be used) for the duration of the COVID-19 declaration under Section 564(b)(1) of the Act, 21 U.S.C.section 360bbb-3(b)(1), unless the authorization is terminated  or revoked sooner.       Influenza A by PCR NEGATIVE NEGATIVE   Influenza B by PCR NEGATIVE NEGATIVE    Comment: (NOTE) The Xpert Xpress SARS-CoV-2/FLU/RSV plus assay is intended as an aid in the diagnosis of influenza from Nasopharyngeal swab specimens and should not be used as a sole basis for treatment. Nasal washings and aspirates are unacceptable for Xpert Xpress SARS-CoV-2/FLU/RSV testing.  Fact Sheet for Patients: EntrepreneurPulse.com.au  Fact Sheet for Healthcare Providers: IncredibleEmployment.be  This test is not yet approved or cleared by the [place] FDA and has been authorized for detection and/or diagnosis of SARS-CoV-2 by FDA under an Emergency Use Authorization (EUA). This EUA will remain in effect (meaning this test can be used) for the duration of the COVID-19 declaration under Section 564(b)(1) of the Act, 21 U.S.C. section 360bbb-3(b)(1), unless the authorization is terminated or revoked.  Performed at [facility], [place] [address], [place], [place] [address]   Lactic acid, plasma     Status: None   Collection Time: 06/12/21  6:31 PM  Result Value Ref Range   Lactic Acid, Venous 1.5 0.5 - 1.9 mmol/L    Comment: Performed at [facility] [address], [place], [place] [address]    CT Angio Chest PE W/Cm &/Or Wo Cm  Result Date: 06/12/2021 CLINICAL DATA:  PE suspected,  low/intermediate prob, positive D-dimer EXAM: CT ANGIOGRAPHY CHEST WITH CONTRAST TECHNIQUE: Multidetector CT imaging of  the chest was performed using the standard protocol during bolus administration of intravenous contrast. Multiplanar CT image reconstructions and MIPs were obtained to evaluate the vascular anatomy. CONTRAST:  158mL OMNIPAQUE IOHEXOL 350 MG/ML SOLN COMPARISON:  Radiograph earlier today chest CTA 07/28/2020 FINDINGS: Cardiovascular: There are no filling defects within the pulmonary arteries to suggest pulmonary embolus. The thoracic aorta is normal in caliber. No aortic dissection. Heart size upper normal for age. Trace pericardial effusion. Mediastinum/Nodes: Few scattered prevascular nodes which are likely reactive, and similar in size to prior exam. No hilar adenopathy. No esophageal wall thickening. No thyroid nodule. Lungs/Pleura: Previous COVID pneumonia has resolved. There is slight heterogeneous pulmonary parenchyma suggestive of air trapping or small airways disease. No acute airspace disease. No pleural effusion. No findings of pulmonary edema. Upper Abdomen: Assessed on concurrent abdominal CT, reported separately. Musculoskeletal: Mild thoracic scoliosis. Scattered Schmorl's nodes. No acute osseous abnormalities are seen. Review of the MIP images confirms the above findings. IMPRESSION: 1. No pulmonary embolus. 2. Heterogeneous pulmonary parenchyma suggestive of air trapping or small airways disease. Electronically Signed   By: Keith Rake M.D.   On: 06/12/2021 18:35   CT Abdomen Pelvis W Contrast  Result Date: 06/12/2021 CLINICAL DATA:  Sepsis.  Fever of unknown origin.  Weakness. EXAM: CT ABDOMEN AND PELVIS WITH CONTRAST TECHNIQUE: Multidetector CT imaging of the abdomen and pelvis was performed using the standard protocol following bolus administration of intravenous contrast. CONTRAST:  158mL OMNIPAQUE IOHEXOL 350 MG/ML SOLN COMPARISON:  None. FINDINGS: Lower chest: Assessed  on concurrent chest CTA, reported separately Hepatobiliary: Enlarged liver spanning 23.7 cm cranial caudal. Diffusely decreased hepatic density typical of steatosis. There is focal fatty sparing adjacent to the gallbladder fossa. Partially distended gallbladder. There is no pericholecystic inflammation or calcified gallstone. Pancreas: No ductal dilatation or inflammation. Spleen: Mildly enlarged spanning 13.3 cm AP. No focal splenic abnormality. Adrenals/Urinary Tract: Normal adrenal glands. Homogeneous renal enhancement without hydronephrosis. No visualized renal calculi. Partially distended urinary bladder. No bladder wall thickening. Stomach/Bowel: Findings highly suspicious for perforated appendicitis. There is a 12 x 16 mm calcification likely an appendicolith which is likely in the proximal appendix, through the distal appendix is not well-defined, and there is ill-defined extraluminal air, fluid, and inflammatory change. Mild wall thickening of the ascending colon, felt to be reactive. Occasional fluid-filled loops of small bowel likely reactive ileus. There is no small bowel obstruction. Formed stool in the distal transverse and descending colon. Unremarkable stomach. Vascular/Lymphatic: Nonocclusive thrombus in the portal vein just beyond the portal mesenteric confluence. No definite thrombus within the mesenteric veins. There are multiple prominent ileocolic lymph nodes. Normal caliber abdominal aorta. Reproductive: Prostate is unremarkable. Other: Inflammatory fat stranding in the right lower quadrant with non organized free fluid and extraluminal air, free air is seen for example series 6, images 61 through 70. There is no drainable collection. No free air tracks beyond the right lower quadrant mesentery. Musculoskeletal: Ghost tracks within both femoral head neck junctions. No acute osseous abnormalities are seen. IMPRESSION: 1. Findings highly suspicious for perforated appendicitis. There is a 12 x 16  mm calcification in the right lower quadrant that is likely in the proximal appendix, through the appendix is not well-defined, and there is ill-defined extraluminal air, fluid, and inflammatory change adjacent to the cecum. No organized drainable collection. 2. Nonocclusive thrombus in the portal vein just beyond the portal mesenteric confluence. 3. Hepatosplenomegaly and hepatic steatosis. These results were called by telephone at the time of interpretation on  06/12/2021 at 6:32 pm to provider RACHEL LITTLE , who verbally acknowledged these results. Electronically Signed   By: Keith Rake M.D.   On: 06/12/2021 18:32   DG Chest Port 1 View  Result Date: 06/12/2021 CLINICAL DATA:  Shortness of breath and tachycardia EXAM: PORTABLE CHEST 1 VIEW COMPARISON:  07/26/2020 FINDINGS: The heart size and mediastinal contours are within normal limits. Both lungs are clear. The visualized skeletal structures are unremarkable. IMPRESSION: No active disease. Electronically Signed   By: Inez Catalina M.D.   On: 06/12/2021 15:30    Review of Systems  Constitutional:  Positive for chills, fatigue and fever.  HENT:  Negative for ear discharge, hearing loss and sore throat.   Eyes:  Negative for discharge.  Respiratory:  Negative for cough and shortness of breath.   Cardiovascular:  Negative for chest pain and leg swelling.  Gastrointestinal:  Positive for diarrhea, nausea and vomiting. Negative for abdominal pain and constipation.  Musculoskeletal:  Negative for myalgias and neck pain.  Skin:  Negative for rash.  Allergic/Immunologic: Negative for environmental allergies.  Neurological:  Negative for dizziness and seizures.  Hematological:  Does not bruise/bleed easily.  Psychiatric/Behavioral:  Negative for suicidal ideas.   All other systems reviewed and are negative. Blood pressure 126/86, pulse 93, temperature 98.8 F (37.1 C), temperature source Oral, resp. rate 19, height 5\' 9"  (1.753 m), weight 103.4 kg,  SpO2 99 %. Physical Exam Constitutional:      Appearance: He is well-developed.     Comments: Conversant No acute distress  Eyes:     General: Lids are normal. No scleral icterus.    Comments: Pupils are equal round and reactive No lid lag Moist conjunctiva  Neck:     Thyroid: No thyromegaly.     Trachea: No tracheal tenderness.     Comments: No cervical lymphadenopathy Cardiovascular:     Rate and Rhythm: Normal rate and regular rhythm.     Heart sounds: No murmur heard. Pulmonary:     Effort: Pulmonary effort is normal.     Breath sounds: Normal breath sounds. No wheezing or rales.  Abdominal:     Tenderness: There is no abdominal tenderness. There is no guarding or rebound.     Hernia: No hernia is present.  Skin:    General: Skin is warm.     Findings: No rash.     Nails: There is no clubbing.     Comments: Normal skin turgor  Neurological:     Mental Status: He is alert and oriented to person, place, and time.     Comments: Normal gait and station  Psychiatric:        Judgment: Judgment normal.     Comments: Appropriate affect    Assessment/Plan: 20 year old male with perforated appendicitis, appendicolith, nonocclusive portal vein thrombosis. 1.  At this time secondary to the perforated appendicitis which is likely been ongoing for last 1 to 2 weeks.  We will continue with nonoperative management.  Would recommend continued broad-spectrum antibiotics.  Patient would likely be a candidate for interval appendectomy secondary to appendicolith being present.  This would likely be 6 to 8 weeks down the road.  If patient's condition worsens he may require surgery prior to this time.  2.  Patient with nonocclusive portal vein thrombosis.  Patient has a history of COVID.  Patient may require anticoagulation secondary to portal vein thrombosis.  Would recommend medical admission.   We will follow along.  Ralene Ok 06/12/2021, 8:11 PM

## 2021-06-12 NOTE — ED Notes (Signed)
Report called to Izora Gala, RN receiving pt.

## 2021-06-13 ENCOUNTER — Encounter (HOSPITAL_COMMUNITY): Payer: Self-pay | Admitting: Family Medicine

## 2021-06-13 ENCOUNTER — Other Ambulatory Visit: Payer: Self-pay

## 2021-06-13 ENCOUNTER — Observation Stay (HOSPITAL_COMMUNITY): Payer: BC Managed Care – PPO

## 2021-06-13 DIAGNOSIS — E669 Obesity, unspecified: Secondary | ICD-10-CM | POA: Diagnosis not present

## 2021-06-13 DIAGNOSIS — K760 Fatty (change of) liver, not elsewhere classified: Secondary | ICD-10-CM | POA: Diagnosis not present

## 2021-06-13 DIAGNOSIS — Z79899 Other long term (current) drug therapy: Secondary | ICD-10-CM | POA: Diagnosis not present

## 2021-06-13 DIAGNOSIS — D649 Anemia, unspecified: Secondary | ICD-10-CM | POA: Diagnosis not present

## 2021-06-13 DIAGNOSIS — A419 Sepsis, unspecified organism: Secondary | ICD-10-CM | POA: Diagnosis not present

## 2021-06-13 DIAGNOSIS — R162 Hepatomegaly with splenomegaly, not elsewhere classified: Secondary | ICD-10-CM | POA: Diagnosis not present

## 2021-06-13 DIAGNOSIS — K381 Appendicular concretions: Secondary | ICD-10-CM | POA: Diagnosis present

## 2021-06-13 DIAGNOSIS — Z20822 Contact with and (suspected) exposure to covid-19: Secondary | ICD-10-CM | POA: Diagnosis not present

## 2021-06-13 DIAGNOSIS — R000 Tachycardia, unspecified: Secondary | ICD-10-CM | POA: Diagnosis not present

## 2021-06-13 DIAGNOSIS — I81 Portal vein thrombosis: Secondary | ICD-10-CM | POA: Diagnosis not present

## 2021-06-13 DIAGNOSIS — F809 Developmental disorder of speech and language, unspecified: Secondary | ICD-10-CM | POA: Diagnosis present

## 2021-06-13 DIAGNOSIS — E876 Hypokalemia: Secondary | ICD-10-CM | POA: Diagnosis present

## 2021-06-13 DIAGNOSIS — Z8616 Personal history of COVID-19: Secondary | ICD-10-CM | POA: Diagnosis not present

## 2021-06-13 DIAGNOSIS — Z68.41 Body mass index (BMI) pediatric, greater than or equal to 95th percentile for age: Secondary | ICD-10-CM | POA: Diagnosis not present

## 2021-06-13 DIAGNOSIS — K3532 Acute appendicitis with perforation and localized peritonitis, without abscess: Secondary | ICD-10-CM | POA: Diagnosis not present

## 2021-06-13 DIAGNOSIS — E230 Hypopituitarism: Secondary | ICD-10-CM | POA: Diagnosis not present

## 2021-06-13 DIAGNOSIS — R739 Hyperglycemia, unspecified: Secondary | ICD-10-CM | POA: Diagnosis present

## 2021-06-13 DIAGNOSIS — Z886 Allergy status to analgesic agent status: Secondary | ICD-10-CM | POA: Diagnosis not present

## 2021-06-13 DIAGNOSIS — F909 Attention-deficit hyperactivity disorder, unspecified type: Secondary | ICD-10-CM | POA: Diagnosis present

## 2021-06-13 DIAGNOSIS — F82 Specific developmental disorder of motor function: Secondary | ICD-10-CM | POA: Diagnosis present

## 2021-06-13 DIAGNOSIS — T368X5A Adverse effect of other systemic antibiotics, initial encounter: Secondary | ICD-10-CM | POA: Diagnosis not present

## 2021-06-13 DIAGNOSIS — Z888 Allergy status to other drugs, medicaments and biological substances status: Secondary | ICD-10-CM | POA: Diagnosis not present

## 2021-06-13 LAB — CBC
HCT: 35.9 % — ABNORMAL LOW (ref 39.0–52.0)
Hemoglobin: 11.6 g/dL — ABNORMAL LOW (ref 13.0–17.0)
MCH: 27.1 pg (ref 26.0–34.0)
MCHC: 32.3 g/dL (ref 30.0–36.0)
MCV: 83.9 fL (ref 80.0–100.0)
Platelets: 194 10*3/uL (ref 150–400)
RBC: 4.28 MIL/uL (ref 4.22–5.81)
RDW: 14.7 % (ref 11.5–15.5)
WBC: 14 10*3/uL — ABNORMAL HIGH (ref 4.0–10.5)
nRBC: 0 % (ref 0.0–0.2)

## 2021-06-13 LAB — HEPARIN LEVEL (UNFRACTIONATED)
Heparin Unfractionated: <0.1 IU/mL — ABNORMAL LOW (ref 0.30–0.70)
Heparin Unfractionated: <0.1 IU/mL — ABNORMAL LOW (ref 0.30–0.70)

## 2021-06-13 LAB — BASIC METABOLIC PANEL
Anion gap: 6 (ref 5–15)
BUN: 6 mg/dL (ref 6–20)
CO2: 25 mmol/L (ref 22–32)
Calcium: 8.4 mg/dL — ABNORMAL LOW (ref 8.9–10.3)
Chloride: 107 mmol/L (ref 98–111)
Creatinine, Ser: 0.61 mg/dL (ref 0.61–1.24)
GFR, Estimated: >60 mL/min (ref >60)
Glucose, Bld: 117 mg/dL — ABNORMAL HIGH (ref 70–99)
Potassium: 3.9 mmol/L (ref 3.5–5.1)
Sodium: 138 mmol/L (ref 135–145)

## 2021-06-13 LAB — HEMOGLOBIN A1C
Hgb A1c MFr Bld: 6.7 % — ABNORMAL HIGH (ref 4.8–5.6)
Mean Plasma Glucose: 145.59 mg/dL

## 2021-06-13 MED ORDER — POTASSIUM CHLORIDE 20 MEQ PO PACK
40.0000 meq | PACK | Freq: Once | ORAL | Status: AC
  Administered 2021-06-13: 40 meq via ORAL
  Filled 2021-06-13: qty 2

## 2021-06-13 NOTE — Progress Notes (Signed)
ANTICOAGULATION CONSULT NOTE - Initial Consult  Pharmacy Consult for Heparin  Indication: Non-occlusive portal vein thrombosis   Patient Measurements: Height: 5\' 9"  (175.3 cm) Weight: 106.2 kg (234 lb 2.1 oz) IBW/kg (Calculated) : 70.7  Vital Signs: Temp: 98.9 F (37.2 C) (07/02 0700) Temp Source: Oral (07/02 0700) BP: 124/79 (07/02 0700) Pulse Rate: 106 (07/02 0700)  Labs: Recent Labs    06/12/21 1457 06/13/21 0811  HGB 11.0* 11.6*  HCT 35.2* 35.9*  PLT 199 194  HEPARINUNFRC  --  <0.10*  CREATININE 0.69  --      Estimated Creatinine Clearance: 178.3 mL/min (by C-G formula based on SCr of 0.69 mg/dL).   Medical History: Past Medical History:  Diagnosis Date   ADHD (attention deficit hyperactivity disorder)    Gross motor development delay    PONV (postoperative nausea and vomiting)    Speech delay    Velopharyngeal insufficiency, congenital     Assessment: 20 y/o M who presented to the ED with 2 weeks of intermittent N/V, chills, and tachycardia. Found to have perforated appendicitis. Also noted to have elevated D-dimer and Non-occlusive portal vein thrombosis. Starting heparin per pharmacy. Hgb 11.6 this morning and renal function good. HL this morning is undetectable.   Goal of Therapy:  Heparin level 0.3-0.7 units/ml Monitor platelets by anticoagulation protocol: Yes   Plan:  Increase heparin drip to 1500 units/hr 6-hour heparin level Daily CBC/Heparin level Monitor for bleeding  Cephus Slater, PharmD, MBA Pharmacy Resident [phone] 06/13/2021 9:41 AM

## 2021-06-13 NOTE — Progress Notes (Signed)
Progress Note     Subjective: Patient reports lower abdominal pain that he reports is from being hungry. He is having green diarrhea. He denies nausea or vomiting. He denies ttp of abdomen. He would like to eat a regular diet.   Objective: Vital signs in last 24 hours: Temp:  [98.6 F (37 C)-99.3 F (37.4 C)] 98.9 F (37.2 C) (07/02 0700) Pulse Rate:  [85-167] 106 (07/02 0700) Resp:  [16-35] 18 (07/02 0700) BP: (98-138)/(57-94) 124/79 (07/02 0700) SpO2:  [96 %-100 %] 99 % (07/02 0700) Weight:  [103.4 kg-106.2 kg] 106.2 kg (07/01 2244) Last BM Date: 06/13/21  Intake/Output from previous day: 07/01 0701 - 07/02 0700 In: 942.1 [I.V.:742.1; IV Piggyback:200] Out: 1300 [Urine:1300] Intake/Output this shift: Total I/O In: 0  Out: 300 [Urine:300]  PE: General: pleasant, WD, obese male who is laying in bed in NAD Heart: regular, rate, and rhythm.   Lungs: Respiratory effort nonlabored Abd: soft, NT, ND, +BS, no masses, hernias, or organomegaly MS: all 4 extremities are symmetrical with no cyanosis, clubbing, or edema. Skin: warm and dry with no masses, lesions, or rashes Neuro: Cranial nerves 2-12 grossly intact, sensation is normal throughout Psych: A&Ox3 with an appropriate affect.    Lab Results:  Recent Labs    06/12/21 1457  WBC 14.6*  HGB 11.0*  HCT 35.2*  PLT 199   BMET Recent Labs    06/12/21 1457  NA 141  K 3.2*  CL 112*  CO2 22  GLUCOSE 128*  BUN 10  CREATININE 0.69  CALCIUM 7.6*   PT/INR No results for input(s): LABPROT, INR in the last 72 hours. CMP     Component Value Date/Time   NA 141 06/12/2021 1457   K 3.2 (L) 06/12/2021 1457   CL 112 (H) 06/12/2021 1457   CO2 22 06/12/2021 1457   GLUCOSE 128 (H) 06/12/2021 1457   BUN 10 06/12/2021 1457   CREATININE 0.69 06/12/2021 1457   CREATININE 0.40 02/26/2013 1400   CALCIUM 7.6 (L) 06/12/2021 1457   PROT 5.3 (L) 06/12/2021 1457   ALBUMIN 2.3 (L) 06/12/2021 1457   AST 74 (H) 06/12/2021  1457   ALT 114 (H) 06/12/2021 1457   ALKPHOS 132 (H) 06/12/2021 1457   BILITOT 0.9 06/12/2021 1457   GFRNONAA >60 06/12/2021 1457   GFRAA >60 07/30/2020 0348   Lipase  No results found for: LIPASE     Studies/Results: CT Angio Chest PE W/Cm &/Or Wo Cm  Result Date: 06/12/2021 CLINICAL DATA:  PE suspected, low/intermediate prob, positive D-dimer EXAM: CT ANGIOGRAPHY CHEST WITH CONTRAST TECHNIQUE: Multidetector CT imaging of the chest was performed using the standard protocol during bolus administration of intravenous contrast. Multiplanar CT image reconstructions and MIPs were obtained to evaluate the vascular anatomy. CONTRAST:  169mL OMNIPAQUE IOHEXOL 350 MG/ML SOLN COMPARISON:  Radiograph earlier today chest CTA 07/28/2020 FINDINGS: Cardiovascular: There are no filling defects within the pulmonary arteries to suggest pulmonary embolus. The thoracic aorta is normal in caliber. No aortic dissection. Heart size upper normal for age. Trace pericardial effusion. Mediastinum/Nodes: Few scattered prevascular nodes which are likely reactive, and similar in size to prior exam. No hilar adenopathy. No esophageal wall thickening. No thyroid nodule. Lungs/Pleura: Previous COVID pneumonia has resolved. There is slight heterogeneous pulmonary parenchyma suggestive of air trapping or small airways disease. No acute airspace disease. No pleural effusion. No findings of pulmonary edema. Upper Abdomen: Assessed on concurrent abdominal CT, reported separately. Musculoskeletal: Mild thoracic scoliosis. Scattered Schmorl's nodes. No  acute osseous abnormalities are seen. Review of the MIP images confirms the above findings. IMPRESSION: 1. No pulmonary embolus. 2. Heterogeneous pulmonary parenchyma suggestive of air trapping or small airways disease. Electronically Signed   By: Keith Rake M.D.   On: 06/12/2021 18:35   CT Abdomen Pelvis W Contrast  Result Date: 06/12/2021 CLINICAL DATA:  Sepsis.  Fever of unknown  origin.  Weakness. EXAM: CT ABDOMEN AND PELVIS WITH CONTRAST TECHNIQUE: Multidetector CT imaging of the abdomen and pelvis was performed using the standard protocol following bolus administration of intravenous contrast. CONTRAST:  156mL OMNIPAQUE IOHEXOL 350 MG/ML SOLN COMPARISON:  None. FINDINGS: Lower chest: Assessed on concurrent chest CTA, reported separately Hepatobiliary: Enlarged liver spanning 23.7 cm cranial caudal. Diffusely decreased hepatic density typical of steatosis. There is focal fatty sparing adjacent to the gallbladder fossa. Partially distended gallbladder. There is no pericholecystic inflammation or calcified gallstone. Pancreas: No ductal dilatation or inflammation. Spleen: Mildly enlarged spanning 13.3 cm AP. No focal splenic abnormality. Adrenals/Urinary Tract: Normal adrenal glands. Homogeneous renal enhancement without hydronephrosis. No visualized renal calculi. Partially distended urinary bladder. No bladder wall thickening. Stomach/Bowel: Findings highly suspicious for perforated appendicitis. There is a 12 x 16 mm calcification likely an appendicolith which is likely in the proximal appendix, through the distal appendix is not well-defined, and there is ill-defined extraluminal air, fluid, and inflammatory change. Mild wall thickening of the ascending colon, felt to be reactive. Occasional fluid-filled loops of small bowel likely reactive ileus. There is no small bowel obstruction. Formed stool in the distal transverse and descending colon. Unremarkable stomach. Vascular/Lymphatic: Nonocclusive thrombus in the portal vein just beyond the portal mesenteric confluence. No definite thrombus within the mesenteric veins. There are multiple prominent ileocolic lymph nodes. Normal caliber abdominal aorta. Reproductive: Prostate is unremarkable. Other: Inflammatory fat stranding in the right lower quadrant with non organized free fluid and extraluminal air, free air is seen for example series  6, images 61 through 70. There is no drainable collection. No free air tracks beyond the right lower quadrant mesentery. Musculoskeletal: Ghost tracks within both femoral head neck junctions. No acute osseous abnormalities are seen. IMPRESSION: 1. Findings highly suspicious for perforated appendicitis. There is a 12 x 16 mm calcification in the right lower quadrant that is likely in the proximal appendix, through the appendix is not well-defined, and there is ill-defined extraluminal air, fluid, and inflammatory change adjacent to the cecum. No organized drainable collection. 2. Nonocclusive thrombus in the portal vein just beyond the portal mesenteric confluence. 3. Hepatosplenomegaly and hepatic steatosis. These results were called by telephone at the time of interpretation on 06/12/2021 at 6:32 pm to provider RACHEL LITTLE , who verbally acknowledged these results. Electronically Signed   By: Keith Rake M.D.   On: 06/12/2021 18:32   DG Chest Port 1 View  Result Date: 06/12/2021 CLINICAL DATA:  Shortness of breath and tachycardia EXAM: PORTABLE CHEST 1 VIEW COMPARISON:  07/26/2020 FINDINGS: The heart size and mediastinal contours are within normal limits. Both lungs are clear. The visualized skeletal structures are unremarkable. IMPRESSION: No active disease. Electronically Signed   By: Inez Catalina M.D.   On: 06/12/2021 15:30    Anti-infectives: Anti-infectives (From admission, onward)    Start     Dose/Rate Route Frequency Ordered Stop   06/13/21 0100  piperacillin-tazobactam (ZOSYN) IVPB 3.375 g  Status:  Discontinued        3.375 g 12.5 mL/hr over 240 Minutes Intravenous Every 8 hours 06/12/21 2132 06/12/21 2236  06/13/21 0000  cefTRIAXone (ROCEPHIN) 2 g in sodium chloride 0.9 % 100 mL IVPB        2 g 200 mL/hr over 30 Minutes Intravenous Every 24 hours 06/12/21 2236     06/12/21 2300  metroNIDAZOLE (FLAGYL) IVPB 500 mg        500 mg 100 mL/hr over 60 Minutes Intravenous Every 8 hours  06/12/21 2236     06/12/21 1826  vancomycin (VANCOCIN) 2,000 mg in sodium chloride 0.9 % 500 mL IVPB        2,000 mg 250 mL/hr over 120 Minutes Intravenous  Once 06/12/21 1725 06/12/21 2051   06/12/21 1645  piperacillin-tazobactam (ZOSYN) IVPB 3.375 g        3.375 g 100 mL/hr over 30 Minutes Intravenous  Once 06/12/21 1631 06/12/21 1800        Assessment/Plan Perforated appendicitis with appendicolith - WBC 14.6 on admission, labs not yet drawn today  - abd exam benign  - continue abx - ok to have regular diet after [place] today since he has been tolerating diet at home - will likely need interval appendectomy in 6-8 weeks, if patient worsens may require surgery sooner but we will follow  Nonocclusive portal vein thrombosis - per primary service, awaiting liver doppler  Hx of COVID  FEN: ok to have diet from my standpoint VTE: SCDs, heparin gtt ID: zosyn/vanc 7/1; rocephin/flagyl 7/2>>  LOS: 0 days    Norm Parcel, [facility] Surgery 06/13/2021, 8:37 AM Please see Amion for pager number during day hours 7:00am-4:30pm

## 2021-06-13 NOTE — Hospital Course (Addendum)
Juan Valencia is a 21 y.o. male who presented with 2 weeks of intermittent fevers preceded by nausea, vomiting found to have perforated appendicitis as well as portal vein thrombus.  Portal vein thrombosis Patient found to have nonocclusive portal vein thrombosis on abdominal and pelvic CT scan.  Patient noted to have elevated D-dimer of 8.05 on admission.  Patient denied any respiratory symptoms and had negative chest CTA for pulmonary embolism.  Patient was noted to have some heterogeneous air trapping and small airway disease on the chest CT imaging.  In regards to portal vein thrombus, he was treated with IV heparin and transition to oral anticoagulation with Eliquis.   Perforated appendicitis Patient presented after almost 2 weeks of intermittent fevers, vomiting, diarrhea with mild short-lived abdominal pain.  Patient was noted to not have any signs of peritoneal irritation nor obvious McBurney's point pain on admission.  He was found to have elevated white blood cell count of 14 and otherwise normal hemoglobin.  Patient underwent abdominal CT and pelvic imaging which showed signs of a perforated appendicitis without peritoneal signs of inflammation.  Patient's fevers prior to admission were elevated as high as 104.  Patient was evaluated by general surgery just prior to admission and recommended for medical management with antibiotics and consideration for surgery in 6-8 weeks pending improvement in white count. Patient received 1 dose of vancomycin in the ED but began to show symptoms of "red man" syndrome as well as  swelling of his lips, of note there was no report of onset of respiratory symptoms, no wheezing or no shortness of breath. Patient's mother requested that he receive no more of the vancomycin due to the reaction at the time of presentation to the ED, patient was afebrile.  Sinus tachycardia Prior to presentation to the ED, patient was evaluated by EMS and his home.  Patient's heart  rate had increased to 180 bpm.  There was concern that the patient was in superior ventricular tachycardia so he was given 2 doses 6, 12 mg of adenosine and then transported to the ED.  Upon presentation to the ED, EKG findings were consistent with sinus tachycardia.  Heart rate improved following fluid boluses and defervescence of his fever.  Patient was monitored on cardiac monitors and had normalized heart rate for the remainder of hospitalization.  Issues for follow-up 1.Continue Eliquis for 3 months total 2. Recommend see outpatient heme follow up for portal vein thrombosis and hepatosplenomegalty 3. Recommend recheck A1C in outpatient setting. 4. Consider cardiology follow up if continues tachycardia  5. Recommend outpatient endocrine consult for hepatic steatosis and hyperglycemia 6. Recommend GI follow up: elevated AST, ALT, downtrending by time of discharge.

## 2021-06-13 NOTE — Progress Notes (Signed)
Family Medicine Teaching Service Daily Progress Note Intern Pager: [phone]  Patient name: Juan Valencia Medical record number: [id number] Date of birth: 29-Nov-2001 Age: 20 y.o. Gender: male  Primary Care Provider: Harrie Jeans, MD Consultants: General surgery Code Status: Full  Pt Overview and Major Events to Date:  7/1-admitted  Assessment and Plan: Juan Valencia is a 20 y.o. male presenting with vomiting found to have portal vein thrombosis and perforated appendicitis. PMH is significant for ADHD.  Perforated appendicitis with calcification: Morning WBC 14.0.  Patient remains afebrile.  Patient has been transition from vancomycin to ceftriaxone and metronidazole due to burning sensation while on vancomycin. -Appreciate surgical recommendations, expect patient follow-up 6 to 8 weeks with general surgery as recommended by Dr. Rosendo Gros -Ceftriaxone and metronidazole IV as above -Follow blood and urine cultures -Continue LR at 125 mL/h -Monitor fever curve  Portal vein thrombosis This was a finding on abdominal pelvic CT thought to be related to inflammation from his perforated appendicitis.  He does have some risk factors including appendicitis, obesity, and concurrent sepsis criteria.   -Continue to monitor LFTs. -Liver Doppler ordered -Heparin per pharmacy, if it does not appear the patient will receive any surgical intervention will transition to Eliquis for duration of 3 months.  Can consider doing this on 7/3 or 7/4 according to the hospital course. -Monitor hemoglobin/CBC -Discussed with hematology oncology for duration of anticoagulation-recommended 3 months. -Recommend patient have outpatient heme follow-up for bladder splenomegaly with portal vein thrombosis.  This has been added to the discharge summary.  Sinus tachycardia Heart rate mostly improved overnight with most heart rates in the 90s.  Patient did have a elevated heart rate of 107 this morning. At the time of this  morning's EKG patient's heart rate was elevated at 126.  EKG shows sinus tachycardia with normal axis, good transition, similar to yesterday's EKG other than slightly decreased rate (167 bpm yesterday) -Continuous cardiac monitoring -We will check a.m. EKG  Elevated A1C A1c 6.7 during hospitalization. This should be repeated in outpatient setting. -Recommend repeat A1C in outpatient setting.   FEN/GI: Regular diet PPx: Heparin drip  Dispo:Home in 2-3 days. Barriers include continued medical work-up.   Subjective:  States he feels a bit better today.  Has had some diarrhea which is likely due to his antibiotic regimen.  Objective: Temp:  [98.6 F (37 C)-99.3 F (37.4 C)] 99.3 F (37.4 C) (07/02 0517) Pulse Rate:  [85-167] 107 (07/02 0517) Resp:  [16-35] 18 (07/02 0517) BP: (98-138)/(57-94) 138/94 (07/02 0517) SpO2:  [96 %-100 %] 98 % (07/02 0517) Weight:  [103.4 kg-106.2 kg] 106.2 kg (07/01 2244) Physical Exam: General: Alert and oriented in no apparent distress Heart: Tachycardic rate with regular sounding rhythm with no murmurs appreciated Lungs: CTA bilaterally, no wheezing Abdomen: Bowel sounds present, no abdominal pain Skin: Warm and dry Extremities: No lower extremity edema   Laboratory: Recent Labs  Lab 06/12/21 1457  WBC 14.6*  HGB 11.0*  HCT 35.2*  PLT 199   Recent Labs  Lab 06/12/21 1457  NA 141  K 3.2*  CL 112*  CO2 22  BUN 10  CREATININE 0.69  CALCIUM 7.6*  PROT 5.3*  BILITOT 0.9  ALKPHOS 132*  ALT 114*  AST 74*  GLUCOSE 128*    Lurline Del, DO 06/13/2021, 7:01 AM PGY-3, [place] Intern pager: [phone], text pages welcome

## 2021-06-13 NOTE — Discharge Instructions (Addendum)
Dear Juan Valencia,  Thank you for letting us participate in your care. You were hospitalized for perforated appendicitis and portal vein thrombosis. You were treated with IV antibiotics and a blood thinner for your clot.  You will be going home on an oral antibiotic called Augmentin. You will take this twice per day with your last day being July 14th. We are also sending you home with a blood thinner called Eliquis (apixaban) that you will take as follows: two pills twice daily for seven days, then you will switch to taking one pill twice daily for a total of three months. I recommend that you see an outpatient hematologist for your portal vein clot and enlarged liver to see if anything else needs to be done.   Your A1c, a measure of your average blood sugar, was elevated during the hospital course at 6.7 and I recommend this is rechecked in a few months. You should also discuss with your doctor about seeing an endocrinologist for the hepatic steatosis and hyperglycemia, we will put this recommendation in the discharge summary for your PCP.  We did give you a sample pack of Eliquis to take for a few days.  It is important that you take 10 mg twice per day for the first 1 week followed by 5 mg twice per day for 3 total months.  POST-HOSPITAL & CARE INSTRUCTIONS Take your antibiotic as discussed above Take your Eliquis as discussed above Follow up with your surgeon as below Make an appointment with your primary care doctor in the next 1-2 weeks 5.   Your PCP will connect you to a hematologist (blood doctor) to discuss your blood clot 6.   Your PCP may also connect your with an endocrinologist to discuss your fatty liver and high blood sugars 7.   If you develop fevers or sudden, severe abdominal pain, please return to the Emergency Department for evaluation   DOCTOR'S APPOINTMENT   No future appointments.  Follow-up Information     Ralene Ok, MD. Schedule an appointment as soon as possible  for a visit in 3 week(s).   Specialty: General Surgery Why: Follow up for perforated appendicitis and discuss planning for interval appendectomy Contact information: [address]  [place] [address] [phone]                 Take care and be well!  [facility]  [place],  [address] [phone]

## 2021-06-13 NOTE — Progress Notes (Signed)
FPTS Brief Progress Note  S: No complaints at this time.   O: BP 118/78 (BP Location: Left Arm)   Pulse (!) 109   Temp 98.6 F (37 C) (Oral)   Resp 16   Ht 5\' 9"  (1.753 m)   Wt 106.2 kg   SpO2 98%   BMI 34.57 kg/m   General: Stable, no acute distress. Age appropriate. Resting comfortably in bed.  Respiratory: normal effort  A/P: Perforated appendicitis - Orders reviewed. Labs for AM ordered, which was adjusted as needed.  - If condition changes, plan includes bedside assessment & paging sugery (already consulted) for possible appendectomy.   Gerlene Fee, DO 06/13/2021, 9:28 PM PGY-3, [facility] Night Resident  Please page [phone] with questions.

## 2021-06-13 NOTE — Plan of Care (Signed)

## 2021-06-13 NOTE — Progress Notes (Signed)
Called and spoke to patient's mother to update her on his care.  Spoke in detail over current imaging, treatment plans, and obstacles for disposition.  Patient's mother denies any knowledge of any clotting disorder in the patient or in the family that could account for the portal vein thrombosis.  She also does not know of any specific history of disorders that may account for hepatosplenomegaly noted in the patient.  I mentioned to her that the patient thought he was told he had enlarged liver and spleen after his recent COVID-19 diagnosis last year and she says that though she cannot remember anyone saying this, the patient is typically really good with specific items like that and she trusts his memory.  She states that he was previously on growth hormone and later on testosterone to initiate puberty but denies any other specific medical history  I did discuss that I recommend outpatient hematology follow-up when it comes time for his discharge for the portal vein thrombosis as well as the hepatosplenomegaly.  She was in agreement with this.  I informed her that we will try to reach out to her each day to update her on day today progress.

## 2021-06-13 NOTE — Progress Notes (Signed)
ANTICOAGULATION CONSULT NOTE  Pharmacy Consult for Heparin  Indication: Non-occlusive portal vein thrombosis   Patient Measurements: Height: 5\' 9"  (175.3 cm) Weight: 106.2 kg (234 lb 2.1 oz) IBW/kg (Calculated) : 70.7  Vital Signs: Temp: 98.6 F (37 C) (07/02 1426) Temp Source: Oral (07/02 1426) BP: 118/78 (07/02 1426) Pulse Rate: 109 (07/02 1426)  Labs: Recent Labs    06/12/21 1457 06/13/21 0811 06/13/21 1550  HGB 11.0* 11.6*  --   HCT 35.2* 35.9*  --   PLT 199 194  --   HEPARINUNFRC  --  <0.10* <0.10*  CREATININE 0.69 0.61  --      Estimated Creatinine Clearance: 178.3 mL/min (by C-G formula based on SCr of 0.61 mg/dL).   Medical History: Past Medical History:  Diagnosis Date   ADHD (attention deficit hyperactivity disorder)    Gross motor development delay    PONV (postoperative nausea and vomiting)    Speech delay    Velopharyngeal insufficiency, congenital     Assessment: Juan Valencia who presented to the ED with 2 weeks of intermittent N/V, chills, and tachycardia. Found to have perforated appendicitis. Also noted to have elevated D-dimer and Non-occlusive portal vein thrombosis. Starting heparin per pharmacy.   Heparin level is undetectable again on 1500 units/hr. Hgb 11.6, plt 194. No s/sx of bleeding or infusion issues.   Goal of Therapy:  Heparin level 0.3-0.7 units/ml Monitor platelets by anticoagulation protocol: Yes   Plan:  Increase heparin drip to 1750 units/hr 6-hour heparin level Daily CBC/Heparin level Monitor for bleeding  Antonietta Jewel, PharmD, [place] Pharmacist  Phone: [phone] 06/13/2021 5:56 PM  Please check AMION for all [facility] phone numbers After 10:00 PM, call [place] [phone]

## 2021-06-14 ENCOUNTER — Other Ambulatory Visit: Payer: Self-pay | Admitting: Family Medicine

## 2021-06-14 DIAGNOSIS — D649 Anemia, unspecified: Secondary | ICD-10-CM | POA: Diagnosis present

## 2021-06-14 DIAGNOSIS — K760 Fatty (change of) liver, not elsewhere classified: Secondary | ICD-10-CM

## 2021-06-14 DIAGNOSIS — I81 Portal vein thrombosis: Secondary | ICD-10-CM

## 2021-06-14 DIAGNOSIS — R7303 Prediabetes: Secondary | ICD-10-CM

## 2021-06-14 DIAGNOSIS — K3532 Acute appendicitis with perforation and localized peritonitis, without abscess: Secondary | ICD-10-CM

## 2021-06-14 DIAGNOSIS — R000 Tachycardia, unspecified: Secondary | ICD-10-CM

## 2021-06-14 LAB — CBC
HCT: 32.5 % — ABNORMAL LOW (ref 39.0–52.0)
Hemoglobin: 10.4 g/dL — ABNORMAL LOW (ref 13.0–17.0)
MCH: 26.7 pg (ref 26.0–34.0)
MCHC: 32 g/dL (ref 30.0–36.0)
MCV: 83.5 fL (ref 80.0–100.0)
Platelets: 199 10*3/uL (ref 150–400)
RBC: 3.89 MIL/uL — ABNORMAL LOW (ref 4.22–5.81)
RDW: 14.7 % (ref 11.5–15.5)
WBC: 12.1 10*3/uL — ABNORMAL HIGH (ref 4.0–10.5)
nRBC: 0 % (ref 0.0–0.2)

## 2021-06-14 LAB — BASIC METABOLIC PANEL
Anion gap: 7 (ref 5–15)
BUN: 7 mg/dL (ref 6–20)
CO2: 24 mmol/L (ref 22–32)
Calcium: 7.9 mg/dL — ABNORMAL LOW (ref 8.9–10.3)
Chloride: 106 mmol/L (ref 98–111)
Creatinine, Ser: 0.57 mg/dL — ABNORMAL LOW (ref 0.61–1.24)
GFR, Estimated: >60 mL/min (ref >60)
Glucose, Bld: 145 mg/dL — ABNORMAL HIGH (ref 70–99)
Potassium: 4 mmol/L (ref 3.5–5.1)
Sodium: 137 mmol/L (ref 135–145)

## 2021-06-14 LAB — HEPATIC FUNCTION PANEL
ALT: 83 U/L — ABNORMAL HIGH (ref 0–44)
AST: 40 U/L (ref 15–41)
Albumin: 2.3 g/dL — ABNORMAL LOW (ref 3.5–5.0)
Alkaline Phosphatase: 91 U/L (ref 38–126)
Bilirubin, Direct: 0.2 mg/dL (ref 0.0–0.2)
Indirect Bilirubin: 0.7 mg/dL (ref 0.3–0.9)
Total Bilirubin: 0.9 mg/dL (ref 0.3–1.2)
Total Protein: 5.4 g/dL — ABNORMAL LOW (ref 6.5–8.1)

## 2021-06-14 LAB — HIV ANTIBODY (ROUTINE TESTING W REFLEX): HIV Screen 4th Generation wRfx: NONREACTIVE

## 2021-06-14 LAB — HEPARIN LEVEL (UNFRACTIONATED)
Heparin Unfractionated: <0.1 IU/mL — ABNORMAL LOW (ref 0.30–0.70)
Heparin Unfractionated: 0.11 IU/mL — ABNORMAL LOW (ref 0.30–0.70)

## 2021-06-14 LAB — HEPATITIS B SURFACE ANTIGEN: Hepatitis B Surface Ag: NONREACTIVE

## 2021-06-14 MED ORDER — HEPARIN BOLUS VIA INFUSION
2000.0000 [IU] | Freq: Once | INTRAVENOUS | Status: AC
  Administered 2021-06-14: 2000 [IU] via INTRAVENOUS
  Filled 2021-06-14: qty 2000

## 2021-06-14 MED ORDER — APIXABAN 5 MG PO TABS: 5.0000 mg | ORAL_TABLET | Freq: Two times a day (BID) | ORAL | Status: DC

## 2021-06-14 MED ORDER — APIXABAN 5 MG PO TABS
10.0000 mg | ORAL_TABLET | Freq: Two times a day (BID) | ORAL | Status: DC
  Administered 2021-06-14 – 2021-06-15 (×2): 10 mg via ORAL
  Filled 2021-06-14 (×2): qty 2

## 2021-06-14 MED ORDER — APIXABAN 5 MG PO TABS
5.0000 mg | ORAL_TABLET | Freq: Once | ORAL | Status: AC
  Administered 2021-06-14: 5 mg via ORAL
  Filled 2021-06-14: qty 1

## 2021-06-14 MED ORDER — APIXABAN 5 MG PO TABS
5.0000 mg | ORAL_TABLET | Freq: Two times a day (BID) | ORAL | Status: DC
  Administered 2021-06-14: 5 mg via ORAL
  Filled 2021-06-14: qty 1

## 2021-06-14 MED ORDER — SACCHAROMYCES BOULARDII 250 MG PO CAPS
250.0000 mg | ORAL_CAPSULE | Freq: Two times a day (BID) | ORAL | Status: DC
  Administered 2021-06-14 – 2021-06-15 (×3): 250 mg via ORAL
  Filled 2021-06-14 (×3): qty 1

## 2021-06-14 MED ORDER — AMOXICILLIN-POT CLAVULANATE 875-125 MG PO TABS
1.0000 | ORAL_TABLET | Freq: Two times a day (BID) | ORAL | Status: DC
  Administered 2021-06-14 – 2021-06-15 (×3): 1 via ORAL
  Filled 2021-06-14 (×3): qty 1

## 2021-06-14 NOTE — Plan of Care (Signed)

## 2021-06-14 NOTE — Progress Notes (Addendum)
ANTICOAGULATION CONSULT NOTE - Follow Up Consult  Pharmacy Consult for apixaban Indication: Non-occlusive portal vein thrombosis  Patient Measurements: Height: 5\' 9"  (175.3 cm) Weight: 106.2 kg (234 lb 2.1 oz) IBW/kg (Calculated) : 70.7   Vital Signs: Temp: 98.7 F (37.1 C) (07/03 0519) Temp Source: Oral (07/03 0519) BP: 123/85 (07/03 0519) Pulse Rate: 88 (07/03 0519)  Labs: Recent Labs    06/12/21 1457 06/12/21 1457 06/13/21 0811 06/13/21 1550 06/14/21 0032 06/14/21 0737  HGB 11.0*  --  11.6*  --  10.4*  --   HCT 35.2*  --  35.9*  --  32.5*  --   PLT 199  --  194  --  199  --   HEPARINUNFRC  --    < > <0.10* <0.10* <0.10* 0.11*  CREATININE 0.69  --  0.61  --  0.57*  --    < > = values in this interval not displayed.    Estimated Creatinine Clearance: 178.3 mL/min (A) (by C-G formula based on SCr of 0.57 mg/dL (L)).   Medications:    Assessment: 20 y/o M who presented to the ED with 2 weeks of intermittent N/V, chills, and tachycardia. Found to have perforated appendicitis. Also noted to have elevated D-dimer and Non-occlusive portal vein thrombosis.Patient was initially started on heparin and will transition to apixaban to prepare for discharge. Most recent Hgb 10.4 and no signs of bleeding, but patient does have perforated appendicitis.   Goal of Therapy:   Monitor platelets by anticoagulation protocol: Yes   Plan:  Apixaban 5 MG BID  Monitor for s/sx of bleed   Cephus Slater, PharmD, MBA Pharmacy Resident [phone] 06/14/2021 2:09 PM  Addendum  Since pt has active clot, we will load with apixaban 10mg  PO BID x7 days then 5mg  BID.  Onnie Boer, PharmD, BCIDP, AAHIVP, CPP Infectious Disease Pharmacist 06/14/2021 2:32 PM

## 2021-06-14 NOTE — Progress Notes (Signed)
Family Medicine Teaching Service Daily Progress Note Intern Pager: [phone]  Patient name: Juan Valencia Medical record number: [id number] Date of birth: 06/09/2001 Age: 20 y.o. Gender: male  Primary Care Provider: Harrie Jeans, MD Consultants: general surgery Code Status: full  Pt Overview and Major Events to Date:  7/1 admitted  Assessment and Plan: Ms. Reierson is a 20 yo male presenting with perforated appendicitis and non-occlusive portal vein thrombosis. PMHx includes ADHD.   Perforated appendicitis Patient afebrile last 24 hours, WBC 12.1 (down from 14.0 yesterday). S/p vancomycin x1 (7/1), zosyn x1 (7/1), ceftriaxone x2 (7/2-3), metronidazole x2 days (7/1-3). Abdominal exam reassuring today. Surgery believes he is safe to go home today.  - transition to PO augmentin today 875-125 mg q12  - plan to continue x14 days - plan for surgery follow up in two weeks  Portal vein thrombus  Advanced hepatic steatosis Minimal abdominal pain on exam. LFTs abnormal; AST 04, ALT 83. S/p heparin drip. Plan to switch to Eliquis.  - pharmacy consulted - Eliquis 10 mg BID x7 days then daily after - plan for Eliquis x3 mo - continue monitoring LFTs - recommend heme onc follow up outpatient  Sinus tachycardia Improved, HR last 24 hours 88-99. No complaints per patient.  - recommend cariology follow up outpatient  - awaiting EKG  Elevated A1c A1c 6.7 this hospitalization. Recommend repeat by PCP in outpatient environment. May need endocrinology referral.   FEN/GI: regular PPx: Heparin drip, transition to [place] Dispo:Home tomorrow. Barriers include none.   Subjective:  Patient appears comfortable in bed. Has no complaints. Has small amount of right sided abdominal pain, but does not prevent him from adjusting in bed, ambulating, other activities.   Objective: Temp:  [97.9 F (36.6 C)-98.7 F (37.1 C)] 97.9 F (36.6 C) (07/03 1438) Pulse Rate:  [88-99] 90 (07/03 1438) Resp:  [17-18] 17  (07/03 1438) BP: (114-123)/(76-85) 121/79 (07/03 1438) SpO2:  [97 %-99 %] 97 % (07/03 1438) Physical Exam: General: awake, alert, legs crossed in bed watching tv Cardiovascular: RRR, normal rate today on exam, no murmur appreciated Respiratory: CTAB Abdomen: soft, non-distended, mild TTP over RUQ, no rebound tenderness Extremities: moving all spontaneously, gait normal  Laboratory: Recent Labs  Lab 06/12/21 1457 06/13/21 0811 06/14/21 0032  WBC 14.6* 14.0* 12.1*  HGB 11.0* 11.6* 10.4*  HCT 35.2* 35.9* 32.5*  PLT 199 194 199   Recent Labs  Lab 06/12/21 1457 06/13/21 0811 06/14/21 0032  NA 141 138 137  K 3.2* 3.9 4.0  CL 112* 107 106  CO2 22 25 24   BUN 10 6 7   CREATININE 0.69 0.61 0.57*  CALCIUM 7.6* 8.4* 7.9*  PROT 5.3*  --  5.4*  BILITOT 0.9  --  0.9  ALKPHOS 132*  --  91  ALT 114*  --  83*  AST 74*  --  40  GLUCOSE 128* 117* 145*    Imaging/Diagnostic Tests: DUPLEX ULTRASOUND OF LIVER 06/13/21 IMPRESSION: 1. Advanced hepatic steatosis. 2. Patent portal veins with normal directional flow.  Ezequiel Essex, MD 06/14/2021, 3:24 PM PGY-2, [place] Intern pager: [phone], text pages welcome

## 2021-06-14 NOTE — Progress Notes (Signed)
ANTICOAGULATION CONSULT NOTE Pharmacy Consult for Heparin  Indication: portal vein thrombosis   Patient Measurements: Height: 5\' 9"  (175.3 cm) Weight: 106.2 kg (234 lb 2.1 oz) IBW/kg (Calculated) : 70.7  Vital Signs: Temp: 98.7 F (37.1 C) (07/03 0519) Temp Source: Oral (07/03 0519) BP: 123/85 (07/03 0519) Pulse Rate: 88 (07/03 0519)  Labs: Recent Labs    06/12/21 1457 06/12/21 1457 06/13/21 0811 06/13/21 1550 06/14/21 0032 06/14/21 0737  HGB 11.0*  --  11.6*  --  10.4*  --   HCT 35.2*  --  35.9*  --  32.5*  --   PLT 199  --  194  --  199  --   HEPARINUNFRC  --    < > <0.10* <0.10* <0.10* 0.11*  CREATININE 0.69  --  0.61  --  0.57*  --    < > = values in this interval not displayed.     Estimated Creatinine Clearance: 178.3 mL/min (A) (by C-G formula based on SCr of 0.57 mg/dL (L)).  Assessment: 20 y/o M with perforated appendicitis and found to have nonocclusive portal vein thrombosis, for heparin. Heparin level this AM is 0.11 which is subtherapeutic. Remains on heparin for possible appendectomy if condition improves.   Goal of Therapy:  Heparin level 0.3-0.7 units/ml Monitor platelets by anticoagulation protocol: Yes   Plan:  Increase heparin  2200 units/hr Check heparin level in 6 hours.  Monitor s/sx of bleed   Cephus Slater, PharmD, [facility] Pharmacy Resident [phone] 06/14/2021 8:52 AM

## 2021-06-14 NOTE — Progress Notes (Signed)
   FMTS Attending Brief  Note: Juan Singh, MD   For questions about this patient, please use [email or web address] to page the family medicine resident on call. Pager number [phone].    I  have seen and examined this patient, reviewed their chart. I have discussed this patient with the resident.  Patient reports he ate a big breakfast. Has very mild abdominal pain. No more nausea and vomiting.   Vitals:   06/13/21 2226 06/14/21 0519  BP: 114/76 123/85  Pulse: 99 88  Resp: 17 18  Temp: 98.5 F (36.9 C) 98.7 F (37.1 C)  SpO2: 99% 97%    Cardiac: Regular rate and rhythm. Normal S1/S2. No murmurs, rubs, or gallops appreciated. Lungs: Clear bilaterally to ascultation.  Abdomen: Normoactive bowel sounds. Mild LLQ and RLQ pain.  No rebound or guarding.   Psych: Pleasant and appropriate   Will sign resident note as available.  Perforated Appendicitis updated mother and patient. Transition to Augmentin today. Monitor for fever and abdominal pain.   Portal vein thrombus, discussed dosing with pharmacy. They will adjust appropriately.   Discussed discharge follow up with mother. Will need follow up with: - General Surgery - Hematology - Cardiology (persistent tachycardia since COVID 19) - Endocrinology (previously followed with Dr. Baldo Ash and would like to see adult endocrine)

## 2021-06-14 NOTE — Progress Notes (Signed)
ANTICOAGULATION CONSULT NOTE Pharmacy Consult for Heparin  Indication: portal vein thrombosis   Patient Measurements: Height: 5\' 9"  (175.3 cm) Weight: 106.2 kg (234 lb 2.1 oz) IBW/kg (Calculated) : 70.7  Vital Signs: Temp: 98.5 F (36.9 C) (07/02 2226) Temp Source: Oral (07/02 2226) BP: 114/76 (07/02 2226) Pulse Rate: 99 (07/02 2226)  Labs: Recent Labs    06/12/21 1457 06/13/21 0811 06/13/21 1550 06/14/21 0032  HGB 11.0* 11.6*  --  10.4*  HCT 35.2* 35.9*  --  32.5*  PLT 199 194  --  199  HEPARINUNFRC  --  <0.10* <0.10* <0.10*  CREATININE 0.69 0.61  --  0.57*     Estimated Creatinine Clearance: 178.3 mL/min (A) (by C-G formula based on SCr of 0.57 mg/dL (L)).  Assessment: 20 y/o M with perforated appendicitis and found to have nonocclusive portal vein thrombosis, for heparin   Goal of Therapy:  Heparin level 0.3-0.7 units/ml Monitor platelets by anticoagulation protocol: Yes   Plan:  Heparin 2000 units IV bolus, then increase heparin  2000 units/hr Check heparin level in 6 hours.   Phillis Knack, PharmD, BCPS

## 2021-06-14 NOTE — Progress Notes (Signed)
Progress Note     Subjective: Patient tolerating reg diet, reports diarrhea is improving some. Denies nausea. Mild RLQ pain intermittently.   Objective: Vital signs in last 24 hours: Temp:  [98.5 F (36.9 C)-98.7 F (37.1 C)] 98.7 F (37.1 C) (07/03 0519) Pulse Rate:  [88-109] 88 (07/03 0519) Resp:  [16-18] 18 (07/03 0519) BP: (114-123)/(76-85) 123/85 (07/03 0519) SpO2:  [97 %-99 %] 97 % (07/03 0519) Last BM Date: 06/13/21  Intake/Output from previous day: 07/02 0701 - 07/03 0700 In: 460 [P.O.:460] Out: 1985 [Urine:1985] Intake/Output this shift: No intake/output data recorded.  PE: General: pleasant, WD, obese male who is laying in bed in NAD Heart: regular, rate, and rhythm.   Lungs: Respiratory effort nonlabored Abd: soft, NT, ND, +BS, no masses, hernias, or organomegaly MS: all 4 extremities are symmetrical with no cyanosis, clubbing, or edema. Skin: warm and dry with no masses, lesions, or rashes Neuro: Cranial nerves 2-12 grossly intact, sensation is normal throughout Psych: A&Ox3 with an appropriate affect.   Lab Results:  Recent Labs    06/13/21 0811 06/14/21 0032  WBC 14.0* 12.1*  HGB 11.6* 10.4*  HCT 35.9* 32.5*  PLT 194 199   BMET Recent Labs    06/13/21 0811 06/14/21 0032  NA 138 137  K 3.9 4.0  CL 107 106  CO2 25 24  GLUCOSE 117* 145*  BUN 6 7  CREATININE 0.61 0.57*  CALCIUM 8.4* 7.9*   PT/INR No results for input(s): LABPROT, INR in the last 72 hours. CMP     Component Value Date/Time   NA 137 06/14/2021 0032   K 4.0 06/14/2021 0032   CL 106 06/14/2021 0032   CO2 24 06/14/2021 0032   GLUCOSE 145 (H) 06/14/2021 0032   BUN 7 06/14/2021 0032   CREATININE 0.57 (L) 06/14/2021 0032   CREATININE 0.40 02/26/2013 1400   CALCIUM 7.9 (L) 06/14/2021 0032   PROT 5.4 (L) 06/14/2021 0032   ALBUMIN 2.3 (L) 06/14/2021 0032   AST 40 06/14/2021 0032   ALT 83 (H) 06/14/2021 0032   ALKPHOS 91 06/14/2021 0032   BILITOT 0.9 06/14/2021 0032    GFRNONAA >60 06/14/2021 0032   GFRAA >60 07/30/2020 0348   Lipase  No results found for: LIPASE     Studies/Results: CT Angio Chest PE W/Cm &/Or Wo Cm  Result Date: 06/12/2021 CLINICAL DATA:  PE suspected, low/intermediate prob, positive D-dimer EXAM: CT ANGIOGRAPHY CHEST WITH CONTRAST TECHNIQUE: Multidetector CT imaging of the chest was performed using the standard protocol during bolus administration of intravenous contrast. Multiplanar CT image reconstructions and MIPs were obtained to evaluate the vascular anatomy. CONTRAST:  1104mL OMNIPAQUE IOHEXOL 350 MG/ML SOLN COMPARISON:  Radiograph earlier today chest CTA 07/28/2020 FINDINGS: Cardiovascular: There are no filling defects within the pulmonary arteries to suggest pulmonary embolus. The thoracic aorta is normal in caliber. No aortic dissection. Heart size upper normal for age. Trace pericardial effusion. Mediastinum/Nodes: Few scattered prevascular nodes which are likely reactive, and similar in size to prior exam. No hilar adenopathy. No esophageal wall thickening. No thyroid nodule. Lungs/Pleura: Previous COVID pneumonia has resolved. There is slight heterogeneous pulmonary parenchyma suggestive of air trapping or small airways disease. No acute airspace disease. No pleural effusion. No findings of pulmonary edema. Upper Abdomen: Assessed on concurrent abdominal CT, reported separately. Musculoskeletal: Mild thoracic scoliosis. Scattered Schmorl's nodes. No acute osseous abnormalities are seen. Review of the MIP images confirms the above findings. IMPRESSION: 1. No pulmonary embolus. 2. Heterogeneous pulmonary parenchyma  suggestive of air trapping or small airways disease. Electronically Signed   By: Keith Rake M.D.   On: 06/12/2021 18:35   CT Abdomen Pelvis W Contrast  Result Date: 06/12/2021 CLINICAL DATA:  Sepsis.  Fever of unknown origin.  Weakness. EXAM: CT ABDOMEN AND PELVIS WITH CONTRAST TECHNIQUE: Multidetector CT imaging of the  abdomen and pelvis was performed using the standard protocol following bolus administration of intravenous contrast. CONTRAST:  143mL OMNIPAQUE IOHEXOL 350 MG/ML SOLN COMPARISON:  None. FINDINGS: Lower chest: Assessed on concurrent chest CTA, reported separately Hepatobiliary: Enlarged liver spanning 23.7 cm cranial caudal. Diffusely decreased hepatic density typical of steatosis. There is focal fatty sparing adjacent to the gallbladder fossa. Partially distended gallbladder. There is no pericholecystic inflammation or calcified gallstone. Pancreas: No ductal dilatation or inflammation. Spleen: Mildly enlarged spanning 13.3 cm AP. No focal splenic abnormality. Adrenals/Urinary Tract: Normal adrenal glands. Homogeneous renal enhancement without hydronephrosis. No visualized renal calculi. Partially distended urinary bladder. No bladder wall thickening. Stomach/Bowel: Findings highly suspicious for perforated appendicitis. There is a 12 x 16 mm calcification likely an appendicolith which is likely in the proximal appendix, through the distal appendix is not well-defined, and there is ill-defined extraluminal air, fluid, and inflammatory change. Mild wall thickening of the ascending colon, felt to be reactive. Occasional fluid-filled loops of small bowel likely reactive ileus. There is no small bowel obstruction. Formed stool in the distal transverse and descending colon. Unremarkable stomach. Vascular/Lymphatic: Nonocclusive thrombus in the portal vein just beyond the portal mesenteric confluence. No definite thrombus within the mesenteric veins. There are multiple prominent ileocolic lymph nodes. Normal caliber abdominal aorta. Reproductive: Prostate is unremarkable. Other: Inflammatory fat stranding in the right lower quadrant with non organized free fluid and extraluminal air, free air is seen for example series 6, images 61 through 70. There is no drainable collection. No free air tracks beyond the right lower  quadrant mesentery. Musculoskeletal: Ghost tracks within both femoral head neck junctions. No acute osseous abnormalities are seen. IMPRESSION: 1. Findings highly suspicious for perforated appendicitis. There is a 12 x 16 mm calcification in the right lower quadrant that is likely in the proximal appendix, through the appendix is not well-defined, and there is ill-defined extraluminal air, fluid, and inflammatory change adjacent to the cecum. No organized drainable collection. 2. Nonocclusive thrombus in the portal vein just beyond the portal mesenteric confluence. 3. Hepatosplenomegaly and hepatic steatosis. These results were called by telephone at the time of interpretation on 06/12/2021 at 6:32 pm to provider RACHEL LITTLE , who verbally acknowledged these results. Electronically Signed   By: Keith Rake M.D.   On: 06/12/2021 18:32   DG Chest Port 1 View  Result Date: 06/12/2021 CLINICAL DATA:  Shortness of breath and tachycardia EXAM: PORTABLE CHEST 1 VIEW COMPARISON:  07/26/2020 FINDINGS: The heart size and mediastinal contours are within normal limits. Both lungs are clear. The visualized skeletal structures are unremarkable. IMPRESSION: No active disease. Electronically Signed   By: Inez Catalina M.D.   On: 06/12/2021 15:30   US LIVER DOPPLER  Result Date: 06/13/2021 CLINICAL DATA:  Elevated LFTs EXAM: DUPLEX ULTRASOUND OF LIVER TECHNIQUE: Color and duplex Doppler ultrasound was performed to evaluate the hepatic in-flow and out-flow vessels. COMPARISON:  None. FINDINGS: Liver: Heterogeneous and echogenic hepatic parenchyma with coarsening of the echotexture. The adjacent renal parenchyma appears hypoechoic in comparison. Normal hepatic contour without nodularity. No focal lesion, mass or intrahepatic biliary ductal dilatation. Main Portal Vein size: 0.8 cm Portal Vein Velocities  Main Prox:  34 cm/sec with normal hepatopetal directional flow Main Mid: 35 cm/sec with normal hepatopetal directional flow  Main Dist:  25 cm/sec with normal hepatopetal directional flow Right: 25 cm/sec with normal hepatopetal directional flow Left: 35 cm/sec with normal hepatopetal directional flow Hepatic Vein Velocities Right:  32 cm/sec Middle:  33 cm/sec Left:  42 cm/sec IVC: Present and patent with normal respiratory phasicity. Hepatic Artery Velocity:  Not visualized Splenic Vein Velocity:  27 cm/sec Spleen: 13.5 cm x 15.2 cm x 5.7 cm with a total volume of 208 cm^3 (411 cm^3 is upper limit normal) Portal Vein Occlusion/Thrombus: No Splenic Vein Occlusion/Thrombus: No Ascites: None Varices: None IMPRESSION: 1. Advanced hepatic steatosis. 2. Patent portal veins with normal directional flow. Signed, Criselda Peaches, MD, [facility] Electronically Signed   By: Jacqulynn Cadet M.D.   On: 06/13/2021 12:13    Anti-infectives: Anti-infectives (From admission, onward)    Start     Dose/Rate Route Frequency Ordered Stop   06/14/21 1000  amoxicillin-clavulanate (AUGMENTIN) 875-125 MG per tablet 1 tablet        1 tablet Oral Every 12 hours 06/14/21 0851     06/13/21 0100  piperacillin-tazobactam (ZOSYN) IVPB 3.375 g  Status:  Discontinued        3.375 g 12.5 mL/hr over 240 Minutes Intravenous Every 8 hours 06/12/21 2132 06/12/21 2236   06/13/21 0000  cefTRIAXone (ROCEPHIN) 2 g in sodium chloride 0.9 % 100 mL IVPB  Status:  Discontinued        2 g 200 mL/hr over 30 Minutes Intravenous Every 24 hours 06/12/21 2236 06/14/21 0851   06/12/21 2300  metroNIDAZOLE (FLAGYL) IVPB 500 mg  Status:  Discontinued        500 mg 100 mL/hr over 60 Minutes Intravenous Every 8 hours 06/12/21 2236 06/14/21 0851   06/12/21 1826  vancomycin (VANCOCIN) 2,000 mg in sodium chloride 0.9 % 500 mL IVPB        2,000 mg 250 mL/hr over 120 Minutes Intravenous  Once 06/12/21 1725 06/12/21 2051   06/12/21 1645  piperacillin-tazobactam (ZOSYN) IVPB 3.375 g        3.375 g 100 mL/hr  over 30 Minutes Intravenous  Once 06/12/21 1631 06/12/21 1800        Assessment/Plan Perforated appendicitis with appendicolith - WBC 12.1 from 14.6 - abd exam benign - continue abx - switch to PO abx - tolerating reg diet - will likely need interval appendectomy in 6-8 weeks - will follow up in [facility] office prior to scheduling this  - stable for discharge from a surgical standpoint on 10-14 days PO abx and outpatient follow up  Nonocclusive portal vein thrombosis - per primary service, liver doppler without thrombus  Hx of COVID   FEN: reg diet, SLIV VTE: SCDs, heparin gtt ID: zosyn/vanc 7/1; rocephin/flagyl 7/2>7/3; PO augmentin 7/3>>  LOS: 1 day    Norm Parcel, [facility] Surgery 06/14/2021, 8:51 AM Please see Amion for pager number during day hours 7:00am-4:30pm

## 2021-06-15 DIAGNOSIS — K760 Fatty (change of) liver, not elsewhere classified: Secondary | ICD-10-CM

## 2021-06-15 LAB — COMPREHENSIVE METABOLIC PANEL
ALT: 72 U/L — ABNORMAL HIGH (ref 0–44)
AST: 29 U/L (ref 15–41)
Albumin: 2.4 g/dL — ABNORMAL LOW (ref 3.5–5.0)
Alkaline Phosphatase: 85 U/L (ref 38–126)
Anion gap: 6 (ref 5–15)
BUN: 6 mg/dL (ref 6–20)
CO2: 26 mmol/L (ref 22–32)
Calcium: 8.4 mg/dL — ABNORMAL LOW (ref 8.9–10.3)
Chloride: 107 mmol/L (ref 98–111)
Creatinine, Ser: 0.57 mg/dL — ABNORMAL LOW (ref 0.61–1.24)
GFR, Estimated: >60 mL/min (ref >60)
Glucose, Bld: 125 mg/dL — ABNORMAL HIGH (ref 70–99)
Potassium: 3.9 mmol/L (ref 3.5–5.1)
Sodium: 139 mmol/L (ref 135–145)
Total Bilirubin: 0.7 mg/dL (ref 0.3–1.2)
Total Protein: 5.6 g/dL — ABNORMAL LOW (ref 6.5–8.1)

## 2021-06-15 LAB — CBC
HCT: 34.1 % — ABNORMAL LOW (ref 39.0–52.0)
Hemoglobin: 10.8 g/dL — ABNORMAL LOW (ref 13.0–17.0)
MCH: 26.7 pg (ref 26.0–34.0)
MCHC: 31.7 g/dL (ref 30.0–36.0)
MCV: 84.2 fL (ref 80.0–100.0)
Platelets: 229 10*3/uL (ref 150–400)
RBC: 4.05 MIL/uL — ABNORMAL LOW (ref 4.22–5.81)
RDW: 14.8 % (ref 11.5–15.5)
WBC: 11.8 10*3/uL — ABNORMAL HIGH (ref 4.0–10.5)
nRBC: 0 % (ref 0.0–0.2)

## 2021-06-15 LAB — URINE CULTURE: Culture: 30000 — AB

## 2021-06-15 MED ORDER — APIXABAN (ELIQUIS) VTE STARTER PACK (10MG AND 5MG)
ORAL_TABLET | ORAL | 0 refills | Status: DC
  Filled 2021-06-15: qty 74, 30d supply, fill #0

## 2021-06-15 MED ORDER — AMOXICILLIN-POT CLAVULANATE 875-125 MG PO TABS
1.0000 | ORAL_TABLET | Freq: Two times a day (BID) | ORAL | 0 refills | Status: AC
  Filled 2021-06-15: qty 22, 11d supply, fill #0

## 2021-06-15 MED ORDER — SACCHAROMYCES BOULARDII 250 MG PO CAPS
250.0000 mg | ORAL_CAPSULE | Freq: Two times a day (BID) | ORAL | 0 refills | Status: DC
Start: 2021-06-15 — End: 2021-09-21
  Filled 2021-06-15: qty 60, 30d supply, fill #0

## 2021-06-15 NOTE — Progress Notes (Signed)
FPTS Brief Progress Note  S: Reported to bedside noted to evaluate patient for evening shift.  Patient sleeping.  Patient reports occasional right-sided abdominal pressure that has occurred for an unknown amount of episodes in the last day.  He denies current pressure sensation at this time.  Patient states that he tolerated breakfast and stated that last feeling of abdominal pressure was with dinner.  No report of vomiting. Discussed abdominal pressure with RN in request notification if right-sided abdominal pressure resumes.  O: BP 120/73 (BP Location: Right Arm)   Pulse 84   Temp 98.6 F (37 C) (Oral)   Resp 18   Ht 5\' 9"  (1.753 m)   Wt 106.2 kg   SpO2 98%   BMI 34.57 kg/m    General: Male appearing stated age, sleeping in supine position, no acute distress Respiratory: Normal work of breathing without retractions, stable on room air Cardiac: Regular rate and rhythm without murmurs Abdominal: Minimal tenderness just inferior to umbilicus, no flank ecchymosis, does not appear distended, bowel sounds are present, no right upper quadrant or right lower quadrant tenderness, no epigastric tenderness, no skin changes noted  A/P: Patient is 20 year old male admitted for portal vein thrombosis, continue anticoagulation, Eliquis Perforated appendicitis, continue antibiotics as outlined in a.m. progress note, monitor fever curve overnight Anticoagulation, patient transition to oral anticoagulation during day shift, will monitor for any signs of abdominal bleeding and plan for abdominal and pelvic CT if patient develops signs concerning for peritoneal irritation - Orders reviewed. Labs for AM ordered, which was adjusted as needed.    Eulis Foster, MD 06/15/2021, 2:05 AM PGY-3, [facility] Family [facility] Night Resident  Please page [phone] with questions.

## 2021-06-15 NOTE — Discharge Summary (Addendum)
I have reviewed the below documentation. I have seen the patient on the day of discharge and agree with the below exam.  In addition to below, I recommend the following at follow up:  Recommend the following referrals at follow up: - Cardiology for persistent tachycardia - Endocrinology for prediabetes (A1C in diabetic range, suspect he may be progressing)  - Hematology - General surgery  These referrals have been placed for mother and patient to call.   Recommend a minimum of 3 months of anticoagulation. Would recommend discussion of need for bridging--could bridge with enoxaparin at time of procedure pending Hematology recommendations. Discussed with mother.  Patient needs CBC and CMP at follow up.   Specifically reviewed with patient and mother reasons to return to hospital. Reviewed anticoagulation, reasons to return on 7/4 with patient.    Juan Singh, MD  [facility] Discharge Summary  Patient name: Juan Valencia Medical record number: [id number] Date of birth: 10/01/2001 Age: 20 y.o. Gender: male Date of Admission: 06/12/2021  Date of Discharge: 06/15/2021 Admitting Physician: Juan Malay, MD  Primary Care Provider: Harrie Jeans, MD Consultants: General surgery  Indication for Hospitalization: Perforated appendicits  Discharge Diagnoses/Problem List:  Principal Problem:   Perforated appendicitis Active Problems:   Thrombosis, portal vein   Hypokalemia   Anemia   Hepatic steatosis   Disposition: Home  Discharge Condition: Stable  Discharge Exam:  General: Comfortable appearing eating breakfast, NAD Cardio: Regular rate and rhythm with no murmurs rubs or gallops Pulm: Lungs clear to auscultation in all fields, normal work of breathing on room air Abdomen: Bowel sounds normal throughout, abdomen is nontender to both shallow and deep palpation, and nondistended with no overlying skin changes  Brief  Hospital Course:  Juan Valencia is a 20 y.o. male who presented with 2 weeks of intermittent fevers preceded by nausea, vomiting found to have perforated appendicitis as well as portal vein thrombus.  Portal vein thrombosis Patient found to have nonocclusive portal vein thrombosis on abdominal and pelvic CT scan.  Patient noted to have elevated D-dimer of 8.05 on admission.  Patient denied any respiratory symptoms and had negative chest CTA for pulmonary embolism.  Patient was noted to have some heterogeneous air trapping and small airway disease on the chest CT imaging. Hematology was consulted related to thrombus. Recommended three months of therapy. Spoke with Dr. Marin Olp.  In regards to portal vein thrombus, he was treated with IV heparin and transition to oral anticoagulation with Eliquis.   Perforated appendicitis Patient presented after almost 2 weeks of intermittent fevers, vomiting, diarrhea with mild short-lived abdominal pain.  Patient was noted to not have any signs of peritoneal irritation nor obvious McBurney's point pain on admission.  He was found to have elevated white blood cell count of 14 and otherwise normal hemoglobin.  Patient underwent abdominal CT and pelvic imaging which showed signs of a perforated appendicitis without peritoneal signs of inflammation.  Patient's fevers prior to admission were elevated as high as 104.  Patient was evaluated by general surgery just prior to admission and recommended for medical management with antibiotics and consideration for surgery in 6-8 weeks pending improvement in white count. Patient received 1 dose of vancomycin in the ED but began to show symptoms of "red man" syndrome as well as  swelling of his lips, of note there was no report of onset of respiratory symptoms, no wheezing or no shortness of breath. Transitioned  to ceftriaxone/metronidazole then ultimately Amoxicillin-clavulanate  for a total duration of 14 days of antibiotics.    Hepatosplenomegaly noted on CT, recommend Hematology evaluation, referral placed.   Elevated A1C of 6.7, prior 6.1, recommend consideration of Endocrinology evaluation.   Sinus tachycardia Prior to presentation to the ED, patient was evaluated by EMS and his home.  Patient's heart rate had increased to 180 bpm.  There was concern that the patient was in superior ventricular tachycardia so he was given 2 doses 6, 12 mg of adenosine and then transported to the ED.  Upon presentation to the ED, EKG findings were consistent with sinus tachycardia.  Heart rate improved following fluid boluses and defervescence of his fever.  Patient was monitored on cardiac monitors and had normalized heart rate for the remainder of hospitalization.  Issues for follow-up 1.Continue Eliquis for 3 months total 2. Recommend see outpatient heme follow up for portal vein thrombosis and hepatosplenomegalty 3. Recommend recheck A1C in outpatient setting. 4. Consider cardiology follow up if continues tachycardia  5. Recommend outpatient endocrine consult for hepatic steatosis and hyperglycemia 6. Recommend CMP and CBC at follow up  7. Recommend General surgery follow up for appendecotmy (6-8 weeks interval)   Significant Procedures: none  Significant Labs and Imaging:  Recent Labs  Lab 06/13/21 0811 06/14/21 0032 06/15/21 0039  WBC 14.0* 12.1* 11.8*  HGB 11.6* 10.4* 10.8*  HCT 35.9* 32.5* 34.1*  PLT 194 199 229   Recent Labs  Lab 06/12/21 1457 06/13/21 0811 06/14/21 0032 06/15/21 0039  NA 141 138 137 139  K 3.2* 3.9 4.0 3.9  CL 112* 107 106 107  CO2 22 25 24 26   GLUCOSE 128* 117* 145* 125*  BUN 10 6 7 6   CREATININE 0.69 0.61 0.57* 0.57*  CALCIUM 7.6* 8.4* 7.9* 8.4*  ALKPHOS 132*  --  91 85  AST 74*  --  40 29  ALT 114*  --  83* 72*  ALBUMIN 2.3*  --  2.3* 2.4*     Results/Tests Pending at Time of Discharge: Hepatitis C Quant  Discharge Medications:  Allergies as of 06/15/2021        Reactions   Vancomycin Swelling   Lip swelling, red and puffy, burning in groin/genital region   Guaifenesin & Derivatives Hives   Reaction was to either guaifenesin or dayquil   Lomotil [diphenoxylate-atropine] Hives   Possible reaction to Lomotil - 07/25/2020   Chlorhexidine Rash   Dayquil [pseudoephedrine-apap-dm] Hives   Reaction was to either guaifenesin or dayquil   Tylenol [acetaminophen] Hives        Medication List     STOP taking these medications    ibuprofen 200 MG tablet Commonly known as: ADVIL       TAKE these medications    albuterol 108 (90 Base) MCG/ACT inhaler Commonly known as: VENTOLIN HFA Inhale 2 puffs into the lungs every 6 (six) hours as needed for wheezing or shortness of breath.   amoxicillin-clavulanate 875-125 MG tablet Commonly known as: AUGMENTIN Take 1 tablet by mouth every 12 (twelve) hours for 11 days.   Apixaban Starter Pack (10mg  and 5mg ) Commonly known as: ELIQUIS STARTER PACK Take as directed on package: start with two-5mg  tablets twice daily for 7 days. On day 8, switch to one-5mg  tablet twice daily.   saccharomyces boulardii 250 MG capsule Commonly known as: FLORASTOR Take 1 capsule (250 mg total) by mouth 2 (two) times daily.        Discharge Instructions: Please refer to  Patient Instructions section of EMR for full details.  Patient was counseled important signs and symptoms that should prompt return to medical care, changes in medications, dietary instructions, activity restrictions, and follow up appointments.   Follow-Up Appointments:  Follow-up Information     Ralene Ok, MD. Schedule an appointment as soon as possible for a visit in 3 week(s).   Specialty: General Surgery Why: Follow up for perforated appendicitis and discuss planning for interval appendectomy Contact information: [address] [place] [id number] [phone]                 Eppie Gibson, MD 06/15/2021, 3:24  PM PGY-1, [place]

## 2021-06-15 NOTE — Progress Notes (Signed)
Progress Note     Subjective: Patient denies abdominal pain this AM. Tolerating diet, denies n/v. BM becoming more solid.   Objective: Vital signs in last 24 hours: Temp:  [97.9 F (36.6 C)-98.6 F (37 C)] 98.2 F (36.8 C) (07/04 0347) Pulse Rate:  [75-90] 75 (07/04 0347) Resp:  [17-18] 18 (07/04 0347) BP: (120-134)/(73-91) 134/91 (07/04 0347) SpO2:  [97 %-98 %] 98 % (07/04 0347) Last BM Date: 06/13/21  Intake/Output from previous day: 07/03 0701 - 07/04 0700 In: 540 [P.O.:540] Out: -  Intake/Output this shift: No intake/output data recorded.  PE: General: pleasant, WD, obese male who is laying in bed in NAD Heart: regular, rate, and rhythm.   Lungs: Respiratory effort nonlabored Abd: soft, NT, ND, +BS, no masses, hernias, or organomegaly MS: all 4 extremities are symmetrical with no cyanosis, clubbing, or edema. Skin: warm and dry with no masses, lesions, or rashes Neuro: Cranial nerves 2-12 grossly intact, sensation is normal throughout Psych: A&Ox3 with an appropriate affect.   Lab Results:  Recent Labs    06/14/21 0032 06/15/21 0039  WBC 12.1* 11.8*  HGB 10.4* 10.8*  HCT 32.5* 34.1*  PLT 199 229   BMET Recent Labs    06/14/21 0032 06/15/21 0039  NA 137 139  K 4.0 3.9  CL 106 107  CO2 24 26  GLUCOSE 145* 125*  BUN 7 6  CREATININE 0.57* 0.57*  CALCIUM 7.9* 8.4*   PT/INR No results for input(s): LABPROT, INR in the last 72 hours. CMP     Component Value Date/Time   NA 139 06/15/2021 0039   K 3.9 06/15/2021 0039   CL 107 06/15/2021 0039   CO2 26 06/15/2021 0039   GLUCOSE 125 (H) 06/15/2021 0039   BUN 6 06/15/2021 0039   CREATININE 0.57 (L) 06/15/2021 0039   CREATININE 0.40 02/26/2013 1400   CALCIUM 8.4 (L) 06/15/2021 0039   PROT 5.6 (L) 06/15/2021 0039   ALBUMIN 2.4 (L) 06/15/2021 0039   AST 29 06/15/2021 0039   ALT 72 (H) 06/15/2021 0039   ALKPHOS 85 06/15/2021 0039   BILITOT 0.7 06/15/2021 0039   GFRNONAA >60 06/15/2021 0039    GFRAA >60 07/30/2020 0348   Lipase  No results found for: LIPASE     Studies/Results: US LIVER DOPPLER  Result Date: 06/13/2021 CLINICAL DATA:  Elevated LFTs EXAM: DUPLEX ULTRASOUND OF LIVER TECHNIQUE: Color and duplex Doppler ultrasound was performed to evaluate the hepatic in-flow and out-flow vessels. COMPARISON:  None. FINDINGS: Liver: Heterogeneous and echogenic hepatic parenchyma with coarsening of the echotexture. The adjacent renal parenchyma appears hypoechoic in comparison. Normal hepatic contour without nodularity. No focal lesion, mass or intrahepatic biliary ductal dilatation. Main Portal Vein size: 0.8 cm Portal Vein Velocities Main Prox:  34 cm/sec with normal hepatopetal directional flow Main Mid: 35 cm/sec with normal hepatopetal directional flow Main Dist:  25 cm/sec with normal hepatopetal directional flow Right: 25 cm/sec with normal hepatopetal directional flow Left: 35 cm/sec with normal hepatopetal directional flow Hepatic Vein Velocities Right:  32 cm/sec Middle:  33 cm/sec Left:  42 cm/sec IVC: Present and patent with normal respiratory phasicity. Hepatic Artery Velocity:  Not visualized Splenic Vein Velocity:  27 cm/sec Spleen: 13.5 cm x 15.2 cm x 5.7 cm with a total volume of 208 cm^3 (411 cm^3 is upper limit normal) Portal Vein Occlusion/Thrombus: No Splenic Vein Occlusion/Thrombus: No Ascites: None Varices: None IMPRESSION: 1. Advanced hepatic steatosis. 2. Patent portal veins with normal directional flow. Signed, Myrle Sheng  K. Laurence Ferrari, MD, [facility] Radiology Electronically Signed   By: Jacqulynn Cadet M.D.   On: 06/13/2021 12:13    Anti-infectives: Anti-infectives (From admission, onward)    Start     Dose/Rate Route Frequency Ordered Stop   06/14/21 1000  amoxicillin-clavulanate (AUGMENTIN) 875-125 MG per tablet 1 tablet        1 tablet Oral Every 12 hours 06/14/21 0851     06/13/21 0100  piperacillin-tazobactam  (ZOSYN) IVPB 3.375 g  Status:  Discontinued        3.375 g 12.5 mL/hr over 240 Minutes Intravenous Every 8 hours 06/12/21 2132 06/12/21 2236   06/13/21 0000  cefTRIAXone (ROCEPHIN) 2 g in sodium chloride 0.9 % 100 mL IVPB  Status:  Discontinued        2 g 200 mL/hr over 30 Minutes Intravenous Every 24 hours 06/12/21 2236 06/14/21 0851   06/12/21 2300  metroNIDAZOLE (FLAGYL) IVPB 500 mg  Status:  Discontinued        500 mg 100 mL/hr over 60 Minutes Intravenous Every 8 hours 06/12/21 2236 06/14/21 0851   06/12/21 1826  vancomycin (VANCOCIN) 2,000 mg in sodium chloride 0.9 % 500 mL IVPB        2,000 mg 250 mL/hr over 120 Minutes Intravenous  Once 06/12/21 1725 06/12/21 2051   06/12/21 1645  piperacillin-tazobactam (ZOSYN) IVPB 3.375 g        3.375 g 100 mL/hr over 30 Minutes Intravenous  Once 06/12/21 1631 06/12/21 1800        Assessment/Plan Perforated appendicitis with appendicolith - WBC 11.8 - abd exam benign - recommend total of 14 days abx - continue PO augmentin to complete this upon discharge  - tolerating reg diet - follow up in the office in 3 weeks to schedule interval appendectomy  - stable for discharge from a surgical standpoint  Nonocclusive portal vein thrombosis - per primary service, liver doppler without thrombus, started on eliquis Hx of COVID   FEN: reg diet, SLIV VTE: SCDs, eliquis ID: [email or web address]/vanc 7/1; rocephin/flagyl 7/2>7/3; PO augmentin 7/3>>  LOS: 2 days    Norm Parcel, [facility] Surgery 06/15/2021, 9:02 AM Please see Amion for pager number during day hours 7:00am-4:30pm

## 2021-06-15 NOTE — Progress Notes (Signed)
Martin Majestic to be D/C'd  per MD order.  Discussed with the patient and all questions fully answered.  VSS, Skin clean, dry and intact without evidence of skin break down, no evidence of skin tears noted.  IV catheter discontinued intact. Site without signs and symptoms of complications. Dressing and pressure applied.  An After Visit Summary was printed and given to the patient. Patient received prescription.  D/c education completed with patient/family including follow up instructions, medication list, d/c activities limitations if indicated, with other d/c instructions as indicated by MD - patient able to verbalize understanding, all questions fully answered.   Patient instructed to return to ED, call 911, or call MD for any changes in condition.   Patient to be escorted via [place], and D/C home via private auto.

## 2021-06-16 ENCOUNTER — Other Ambulatory Visit (HOSPITAL_COMMUNITY): Payer: Self-pay

## 2021-06-16 LAB — HCV INTERPRETATION

## 2021-06-16 LAB — HCV AB W REFLEX TO QUANT PCR: HCV Ab: <0.1 s/co ratio (ref 0.0–0.9)

## 2021-06-17 ENCOUNTER — Other Ambulatory Visit: Payer: Self-pay | Admitting: *Deleted

## 2021-06-17 ENCOUNTER — Other Ambulatory Visit (HOSPITAL_COMMUNITY): Payer: Self-pay

## 2021-06-17 ENCOUNTER — Telehealth: Payer: Self-pay | Admitting: Hematology and Oncology

## 2021-06-17 ENCOUNTER — Encounter: Payer: Self-pay | Admitting: *Deleted

## 2021-06-17 LAB — CULTURE, BLOOD (ROUTINE X 2)
Culture: NO GROWTH
Culture: NO GROWTH
Special Requests: ADEQUATE
Special Requests: ADEQUATE

## 2021-06-17 NOTE — Telephone Encounter (Signed)
Received a new hem referral from Dr. Dorris Singh for portal vein thrombosis. Mr. Juan Valencia has been cld and scheduled to see Dr. Lorenso Courier on 7/14 at 1:40pm. Pt aware to arrive 20 minutes early.

## 2021-06-17 NOTE — Patient Outreach (Signed)
[facility]) Care Management  06/17/2021  Juan Valencia [birth year] [id number]   Transition of care call/case closure   Referral received:06/16/21 Initial outreach:06/17/21 Insurance: [employer]    Subjective: Initial successful telephone call to patient's preferred number, patient mother Juan Valencia  in order to complete transition of care assessment; 2 HIPAA identifiers verified. Explained purpose of call and completed transition of care assessment.  She states Juan Valencia is doing fine except for feeling tired. He has been able to return to work today. She denies patient having fever, pain. States he is tolerating diet without difficulty. She discussed continuing to take Eliquis as prescribed no noted signs/symptoms of bleeding. Patient mother explains that as of July patient is no longer on [employer] plan but her husband's plan.  Patient has received follow up appointments for hematology, will follow up with endocrinology . She reports patient will probably have appendectomy in the next 6 weeks.  He uses a [facility] outpatient pharmacy at [place]    Objective:  Juan Valencia  was hospitalized at [facility] 7/2-06/15/21  for Non occulsive Portal vein thrombosis,perforated appendictis , hepatic steatosis Comorbidities include: Covid 19 history  He was discharged to home on 06/15/21 without the need for home health services or DME.   Assessment:  Patient voices good understanding of all discharge instructions.  See transition of care flowsheet for assessment details.   Plan:  Reviewed hospital discharge diagnosis of Perforated Appendicitis, non occlusive portal vein occulusion  and discharge treatment plan using hospital discharge instructions, assessing medication adherence, reviewing problems requiring provider notification, and discussing the importance of follow up with surgeon, primary care provider and/or specialists as directed.   No ongoing care management needs  identified so will close case to [place] Management services and route successful outreach letter with [place] Management pamphlet and 24 Hour Nurse Line Magnet to [place] [facility] clinical pool to be mailed to patient's home address.    Joylene Draft, RN, BSN  [place] Management Coordinator  [phone]- Mobile [phone]- Toll Free Main Office

## 2021-06-25 ENCOUNTER — Other Ambulatory Visit: Payer: Self-pay

## 2021-06-25 ENCOUNTER — Inpatient Hospital Stay (HOSPITAL_BASED_OUTPATIENT_CLINIC_OR_DEPARTMENT_OTHER): Payer: BC Managed Care – PPO | Admitting: Hematology and Oncology

## 2021-06-25 ENCOUNTER — Inpatient Hospital Stay: Payer: BC Managed Care – PPO | Attending: Hematology and Oncology

## 2021-06-25 VITALS — BP 118/85 | HR 112 | Temp 99.0°F | Resp 18 | Ht 69.0 in | Wt 224.1 lb

## 2021-06-25 DIAGNOSIS — K760 Fatty (change of) liver, not elsewhere classified: Secondary | ICD-10-CM | POA: Insufficient documentation

## 2021-06-25 DIAGNOSIS — D6852 Prothrombin gene mutation: Secondary | ICD-10-CM | POA: Diagnosis not present

## 2021-06-25 DIAGNOSIS — Z8616 Personal history of COVID-19: Secondary | ICD-10-CM | POA: Insufficient documentation

## 2021-06-25 DIAGNOSIS — Z801 Family history of malignant neoplasm of trachea, bronchus and lung: Secondary | ICD-10-CM | POA: Insufficient documentation

## 2021-06-25 DIAGNOSIS — Z7901 Long term (current) use of anticoagulants: Secondary | ICD-10-CM | POA: Diagnosis not present

## 2021-06-25 DIAGNOSIS — R160 Hepatomegaly, not elsewhere classified: Secondary | ICD-10-CM | POA: Insufficient documentation

## 2021-06-25 DIAGNOSIS — I81 Portal vein thrombosis: Secondary | ICD-10-CM

## 2021-06-25 DIAGNOSIS — R760 Raised antibody titer: Secondary | ICD-10-CM | POA: Diagnosis not present

## 2021-06-25 DIAGNOSIS — K3532 Acute appendicitis with perforation and localized peritonitis, without abscess: Secondary | ICD-10-CM | POA: Insufficient documentation

## 2021-06-25 LAB — CBC WITH DIFFERENTIAL (CANCER CENTER ONLY)
Abs Immature Granulocytes: 0.04 10*3/uL (ref 0.00–0.07)
Basophils Absolute: 0.1 10*3/uL (ref 0.0–0.1)
Basophils Relative: 1 %
Eosinophils Absolute: 0.1 10*3/uL (ref 0.0–0.5)
Eosinophils Relative: 1 %
HCT: 37.3 % — ABNORMAL LOW (ref 39.0–52.0)
Hemoglobin: 12 g/dL — ABNORMAL LOW (ref 13.0–17.0)
Immature Granulocytes: 0 %
Lymphocytes Relative: 36 %
Lymphs Abs: 3.7 10*3/uL (ref 0.7–4.0)
MCH: 27.6 pg (ref 26.0–34.0)
MCHC: 32.2 g/dL (ref 30.0–36.0)
MCV: 85.7 fL (ref 80.0–100.0)
Monocytes Absolute: 0.5 10*3/uL (ref 0.1–1.0)
Monocytes Relative: 5 %
Neutro Abs: 5.8 10*3/uL (ref 1.7–7.7)
Neutrophils Relative %: 57 %
Platelet Count: 257 10*3/uL (ref 150–400)
RBC: 4.35 MIL/uL (ref 4.22–5.81)
RDW: 15.2 % (ref 11.5–15.5)
WBC Count: 10.2 10*3/uL (ref 4.0–10.5)
nRBC: 0 % (ref 0.0–0.2)

## 2021-06-25 LAB — CMP (CANCER CENTER ONLY)
ALT: 53 U/L — ABNORMAL HIGH (ref 0–44)
AST: 28 U/L (ref 15–41)
Albumin: 3.7 g/dL (ref 3.5–5.0)
Alkaline Phosphatase: 102 U/L (ref 38–126)
Anion gap: 8 (ref 5–15)
BUN: 13 mg/dL (ref 6–20)
CO2: 27 mmol/L (ref 22–32)
Calcium: 9.4 mg/dL (ref 8.9–10.3)
Chloride: 106 mmol/L (ref 98–111)
Creatinine: 0.69 mg/dL (ref 0.61–1.24)
GFR, Estimated: >60 mL/min (ref >60)
Glucose, Bld: 99 mg/dL (ref 70–99)
Potassium: 4.2 mmol/L (ref 3.5–5.1)
Sodium: 141 mmol/L (ref 135–145)
Total Bilirubin: 0.5 mg/dL (ref 0.3–1.2)
Total Protein: 7.8 g/dL (ref 6.5–8.1)

## 2021-06-25 LAB — HEPATITIS PANEL, ACUTE
HCV Ab: NONREACTIVE
Hep A IgM: NONREACTIVE
Hep B C IgM: NONREACTIVE
Hepatitis B Surface Ag: NONREACTIVE

## 2021-06-25 LAB — ANTITHROMBIN III: AntiThromb III Func: 135 % — ABNORMAL HIGH (ref 75–120)

## 2021-06-25 NOTE — Progress Notes (Signed)
[place] [phone]   Fax:([phone] [place] NOTE  Patient Care Team: Harrie Jeans, MD as PCP - General (Pediatrics) Ottis Stain, MD as Pediatrician (Pediatrics)  Hematological/Oncological History # Portal Vein Thrombosis 06/12/2021: patient presented to the ED with tachycardia and fever for 2-week period.  CT abdomen showed findings consistent with a perforated appendix.  Additionally there was noted to be a nonocclusive thrombus in the portal vein just beyond the portal mesenteric confluence.  There was notable hepatomegaly and hepatic steatosis.  CT angio study of the chest did not reveal any signs of pulmonary embolism 06/25/2021: establish care with Dr. Lorenso Courier  CHIEF COMPLAINTS/PURPOSE OF CONSULTATION:  "Portal Vein Thrombosis "  HISTORY OF PRESENTING ILLNESS:  Juan Valencia 20 y.o. male with medical history significant for ADHD, speech delay, motor development delay, and velopharyngeal insufficiency presents for evaluation of a newly diagnosed port vein thrombosis.   On review of the previous records Juan Valencia presented the emergency department on 06/12/2021 with concerns for tachycardia and recurrent fever.  CT scan of the abdomen showed a perforated appendix as well as a nonocclusive thrombus in the portal vein.  The patient was started on anticoagulation therapy as well as medical management for this perforated appendicitis.  CTA of the chest showed no evidence of VTE at that time.  He was started on Eliquis therapy for the VTE.  He was discharged from the hospital with follow-up by hematology to evaluate this unusual clot.  On exam today Juan Valencia is accompanied by his mother.  He reports that his symptoms began approximately 2 to 3 weeks ago.  He has symptoms that he thought were "a virus".  His heart rate went up to as high as 188 which is why he presented to the hospital.  He notes that he did have a COVID infection back in August 2021 at  that time a D-dimer was drawn which was elevated.  No further work-up was pursued at that time.  He does at this time he does not have any pain but does endorse having bloating of his abdomen.  He is not having any fevers chills or sweats but does endorse having diarrhea.  He had no episodes of nausea or vomiting.  He notes that the Eliquis therapy has been working for him fine.  He is having any issues with bleeding, bruising, or difficulties affording the medication.  His personal and family history is unremarkable for any blood diseases or blood disorders.  He is a never smoker and does not drink alcohol.  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  He notes increased frequency of urination reporting that he woke up to urinate 3 times last night.  Full 10 point ROS is listed below.  MEDICAL HISTORY:  Past Medical History:  Diagnosis Date   ADHD (attention deficit hyperactivity disorder)    Gross motor development delay    PONV (postoperative nausea and vomiting)    Speech delay    Velopharyngeal insufficiency, congenital     SURGICAL HISTORY: Past Surgical History:  Procedure Laterality Date   HARDWARE REMOVAL Bilateral 07/26/2019   Procedure: removal of hardware bilateral hips;  Surgeon: Meredith Pel, MD;  Location: [place];  Service: Orthopedics;  Laterality: Bilateral;   HIP PINNING,CANNULATED Bilateral 05/26/2017   Procedure: BILATERAL CANNULATED HIP PINNING;  Surgeon: Meredith Pel, MD;  Location: [place];  Service: Orthopedics;  Laterality: Bilateral;   PHARYNGEAL FLAP  PHARYNGEAL FLAP REVISION      SOCIAL HISTORY: Social History   Socioeconomic History   Marital status: Single    Spouse name: Not on file   Number of children: Not on file   Years of education: Not on file   Highest education level: Not on file  Occupational History   Not on file  Tobacco Use   Smoking status: Never   Smokeless tobacco: Never  Vaping Use   Vaping Use: Never used   Substance and Sexual Activity   Alcohol use: No   Drug use: No   Sexual activity: Not on file  Other Topics Concern   Not on file  Social History Narrative   Lives with parents, twin brother, younger brother. [employer].       Will attend [employer] in the fall, plans to major in business.    Social Determinants of Health   Financial Resource Strain: Not on file  Food Insecurity: Not on file  Transportation Needs: Not on file  Physical Activity: Not on file  Stress: Not on file  Social Connections: Not on file  Intimate Partner Violence: Not on file    FAMILY HISTORY: Family History  Problem Relation Age of Onset   Obesity Mother    Tall stature Father    Hypertension Father    Obesity Maternal Grandmother    Hypertension Maternal Grandmother    Diabetes Maternal Grandmother    Heart disease Paternal Grandmother    Diabetes Paternal Grandmother    Cancer Paternal Grandfather        lung    ALLERGIES:  is allergic to vancomycin, guaifenesin & derivatives, lomotil [diphenoxylate-atropine], chlorhexidine, dayquil [pseudoephedrine-apap-dm], and tylenol [acetaminophen].  MEDICATIONS:  Current Outpatient Medications  Medication Sig Dispense Refill   albuterol (VENTOLIN HFA) 108 (90 Base) MCG/ACT inhaler Inhale 2 puffs into the lungs every 6 (six) hours as needed for wheezing or shortness of breath. 6.7 g 0   amoxicillin-clavulanate (AUGMENTIN) 875-125 MG tablet Take 1 tablet by mouth every 12 (twelve) hours for 11 days. 22 tablet 0   APIXABAN (ELIQUIS) VTE STARTER PACK (10MG  AND 5MG ) Take as directed on package: start with two-5mg  tablets twice daily for 7 days. On day 8, switch to one-5mg  tablet twice daily. 74 tablet 0   saccharomyces boulardii (FLORASTOR) 250 MG capsule Take 1 capsule (250 mg total) by mouth 2 (two) times daily. 60 capsule 0   No current facility-administered medications for this visit.    REVIEW OF SYSTEMS:   Constitutional: ( - ) fevers, (  - )  chills , ( - ) night sweats Eyes: ( - ) blurriness of vision, ( - ) double vision, ( - ) watery eyes Ears, nose, mouth, throat, and face: ( - ) mucositis, ( - ) sore throat Respiratory: ( - ) cough, ( - ) dyspnea, ( - ) wheezes Cardiovascular: ( - ) palpitation, ( - ) chest discomfort, ( - ) lower extremity swelling Gastrointestinal:  ( - ) nausea, ( - ) heartburn, ( - ) change in bowel habits Skin: ( - ) abnormal skin rashes Lymphatics: ( - ) new lymphadenopathy, ( - ) easy bruising Neurological: ( - ) numbness, ( - ) tingling, ( - ) new weaknesses Behavioral/Psych: ( - ) mood change, ( - ) new changes  All other systems were reviewed with the patient and are negative.  PHYSICAL EXAMINATION:  Vitals:   06/25/21 1407  BP: 118/85  Pulse: (!) 112  Resp:  18  Temp: 99 F (37.2 C)  SpO2: 100%   Filed Weights   06/25/21 1407  Weight: 224 lb 1.6 oz (101.7 kg)    GENERAL: well appearing young Caucasian male in NAD  SKIN: skin color, texture, turgor are normal, no rashes or significant lesions EYES: conjunctiva are pink and non-injected, sclera clear LUNGS: clear to auscultation and percussion with normal breathing effort HEART: regular rate & rhythm and no murmurs and no lower extremity edema ABDOMEN: soft, non-tender, non-distended, normal bowel sounds Musculoskeletal: no cyanosis of digits and no clubbing. Hepatomegaly appreciated.   PSYCH: alert & oriented x 3, fluent speech NEURO: no focal motor/sensory deficits  LABORATORY DATA:  I have reviewed the data as listed CBC Latest Ref Rng & Units 06/25/2021 06/15/2021 06/14/2021  WBC 4.0 - 10.5 K/uL 10.2 11.8(H) 12.1(H)  Hemoglobin 13.0 - 17.0 g/dL 12.0(L) 10.8(L) 10.4(L)  Hematocrit 39.0 - 52.0 % 37.3(L) 34.1(L) 32.5(L)  Platelets 150 - 400 K/uL 257 229 199    CMP Latest Ref Rng & Units 06/25/2021 06/15/2021 06/14/2021  Glucose 70 - 99 mg/dL 99 125(H) 145(H)  BUN 6 - 20 mg/dL 13 6 7   Creatinine 0.61 - 1.24 mg/dL 0.69 0.57(L)  0.57(L)  Sodium 135 - 145 mmol/L 141 139 137  Potassium 3.5 - 5.1 mmol/L 4.2 3.9 4.0  Chloride 98 - 111 mmol/L 106 107 106  CO2 22 - 32 mmol/L 27 26 24   Calcium 8.9 - 10.3 mg/dL 9.4 8.4(L) 7.9(L)  Total Protein 6.5 - 8.1 g/dL 7.8 5.6(L) 5.4(L)  Total Bilirubin 0.3 - 1.2 mg/dL 0.5 0.7 0.9  Alkaline Phos 38 - 126 U/L 102 85 91  AST 15 - 41 U/L 28 29 40  ALT 0 - 44 U/L 53(H) 72(H) 83(H)   RADIOGRAPHIC STUDIES: I have personally reviewed the radiological images as listed and agreed with the findings in the report: port vein thrombus, hepatomegaly. CT Angio Chest PE W/Cm &/Or Wo Cm  Result Date: 06/12/2021 CLINICAL DATA:  PE suspected, low/intermediate prob, positive D-dimer EXAM: CT ANGIOGRAPHY CHEST WITH CONTRAST TECHNIQUE: Multidetector CT imaging of the chest was performed using the standard protocol during bolus administration of intravenous contrast. Multiplanar CT image reconstructions and MIPs were obtained to evaluate the vascular anatomy. CONTRAST:  127mL OMNIPAQUE IOHEXOL 350 MG/ML SOLN COMPARISON:  Radiograph earlier today chest CTA 07/28/2020 FINDINGS: Cardiovascular: There are no filling defects within the pulmonary arteries to suggest pulmonary embolus. The thoracic aorta is normal in caliber. No aortic dissection. Heart size upper normal for age. Trace pericardial effusion. Mediastinum/Nodes: Few scattered prevascular nodes which are likely reactive, and similar in size to prior exam. No hilar adenopathy. No esophageal wall thickening. No thyroid nodule. Lungs/Pleura: Previous COVID pneumonia has resolved. There is slight heterogeneous pulmonary parenchyma suggestive of air trapping or small airways disease. No acute airspace disease. No pleural effusion. No findings of pulmonary edema. Upper Abdomen: Assessed on concurrent abdominal CT, reported separately. Musculoskeletal: Mild thoracic scoliosis. Scattered Schmorl's nodes. No acute osseous abnormalities are seen. Review of the MIP images  confirms the above findings. IMPRESSION: 1. No pulmonary embolus. 2. Heterogeneous pulmonary parenchyma suggestive of air trapping or small airways disease. Electronically Signed   By: Keith Rake M.D.   On: 06/12/2021 18:35   CT Abdomen Pelvis W Contrast  Result Date: 06/12/2021 CLINICAL DATA:  Sepsis.  Fever of unknown origin.  Weakness. EXAM: CT ABDOMEN AND PELVIS WITH CONTRAST TECHNIQUE: Multidetector CT imaging of the abdomen and pelvis was performed using the standard  protocol following bolus administration of intravenous contrast. CONTRAST:  138mL OMNIPAQUE IOHEXOL 350 MG/ML SOLN COMPARISON:  None. FINDINGS: Lower chest: Assessed on concurrent chest CTA, reported separately Hepatobiliary: Enlarged liver spanning 23.7 cm cranial caudal. Diffusely decreased hepatic density typical of steatosis. There is focal fatty sparing adjacent to the gallbladder fossa. Partially distended gallbladder. There is no pericholecystic inflammation or calcified gallstone. Pancreas: No ductal dilatation or inflammation. Spleen: Mildly enlarged spanning 13.3 cm AP. No focal splenic abnormality. Adrenals/Urinary Tract: Normal adrenal glands. Homogeneous renal enhancement without hydronephrosis. No visualized renal calculi. Partially distended urinary bladder. No bladder wall thickening. Stomach/Bowel: Findings highly suspicious for perforated appendicitis. There is a 12 x 16 mm calcification likely an appendicolith which is likely in the proximal appendix, through the distal appendix is not well-defined, and there is ill-defined extraluminal air, fluid, and inflammatory change. Mild wall thickening of the ascending colon, felt to be reactive. Occasional fluid-filled loops of small bowel likely reactive ileus. There is no small bowel obstruction. Formed stool in the distal transverse and descending colon. Unremarkable stomach. Vascular/Lymphatic: Nonocclusive thrombus in the portal vein just beyond the portal mesenteric  confluence. No definite thrombus within the mesenteric veins. There are multiple prominent ileocolic lymph nodes. Normal caliber abdominal aorta. Reproductive: Prostate is unremarkable. Other: Inflammatory fat stranding in the right lower quadrant with non organized free fluid and extraluminal air, free air is seen for example series 6, images 61 through 70. There is no drainable collection. No free air tracks beyond the right lower quadrant mesentery. Musculoskeletal: Ghost tracks within both femoral head neck junctions. No acute osseous abnormalities are seen. IMPRESSION: 1. Findings highly suspicious for perforated appendicitis. There is a 12 x 16 mm calcification in the right lower quadrant that is likely in the proximal appendix, through the appendix is not well-defined, and there is ill-defined extraluminal air, fluid, and inflammatory change adjacent to the cecum. No organized drainable collection. 2. Nonocclusive thrombus in the portal vein just beyond the portal mesenteric confluence. 3. Hepatosplenomegaly and hepatic steatosis. These results were called by telephone at the time of interpretation on 06/12/2021 at 6:32 pm to provider RACHEL LITTLE , who verbally acknowledged these results. Electronically Signed   By: Keith Rake M.D.   On: 06/12/2021 18:32   DG Chest Port 1 View  Result Date: 06/12/2021 CLINICAL DATA:  Shortness of breath and tachycardia EXAM: PORTABLE CHEST 1 VIEW COMPARISON:  07/26/2020 FINDINGS: The heart size and mediastinal contours are within normal limits. Both lungs are clear. The visualized skeletal structures are unremarkable. IMPRESSION: No active disease. Electronically Signed   By: Inez Catalina M.D.   On: 06/12/2021 15:30   US LIVER DOPPLER  Result Date: 06/13/2021 CLINICAL DATA:  Elevated LFTs EXAM: DUPLEX ULTRASOUND OF LIVER TECHNIQUE: Color and duplex Doppler ultrasound was performed to evaluate the hepatic in-flow and out-flow vessels. COMPARISON:  None. FINDINGS:  Liver: Heterogeneous and echogenic hepatic parenchyma with coarsening of the echotexture. The adjacent renal parenchyma appears hypoechoic in comparison. Normal hepatic contour without nodularity. No focal lesion, mass or intrahepatic biliary ductal dilatation. Main Portal Vein size: 0.8 cm Portal Vein Velocities Main Prox:  34 cm/sec with normal hepatopetal directional flow Main Mid: 35 cm/sec with normal hepatopetal directional flow Main Dist:  25 cm/sec with normal hepatopetal directional flow Right: 25 cm/sec with normal hepatopetal directional flow Left: 35 cm/sec with normal hepatopetal directional flow Hepatic Vein Velocities Right:  32 cm/sec Middle:  33 cm/sec Left:  42 cm/sec IVC: Present and patent with  normal respiratory phasicity. Hepatic Artery Velocity:  Not visualized Splenic Vein Velocity:  27 cm/sec Spleen: 13.5 cm x 15.2 cm x 5.7 cm with a total volume of 208 cm^3 (411 cm^3 is upper limit normal) Portal Vein Occlusion/Thrombus: No Splenic Vein Occlusion/Thrombus: No Ascites: None Varices: None IMPRESSION: 1. Advanced hepatic steatosis. 2. Patent portal veins with normal directional flow. Signed, Criselda Peaches, MD, [facility] Electronically Signed   By: Jacqulynn Cadet M.D.   On: 06/13/2021 12:13    ASSESSMENT & PLAN Juan Valencia 20 y.o. male with medical history significant for ADHD, speech delay, motor development delay, and velopharyngeal insufficiency presents for evaluation of a newly diagnosed port vein thrombosis.   After review of the labs, review of the records, and discussion with the patient the patients findings are most consistent with a portal vein thrombus of unclear etiology.  The patient does have a perforated appendicitis which could potentially cause local inflammation, but these are not generally associated with portal vein thrombi.  Additionally he has hepatomegaly and hepatic steatosis of remarkable  degree for a person of this age.  These constitute unusual findings and as such I would recommend that we proceed with a hypercoagulable work-up to include JAK2 and PNH testing.  I do believe the patient may benefit from evaluation by gastroenterology given his hepatomegaly and portal vein thrombus.  At this time I agree with continuation of Eliquis 5 mg twice daily.  If no clear etiology can be discerned we could consider this a provoked VTE in the setting of his perforated appendix, though we would have to have a discussion with the patient about the risks and benefits of discontinuing anticoagulation therapy.  # Port Vein Thrombosis #Hepatomegaly/Hepatic Steatosis --will order hypercoagulable workup to include FVL, prothrombin gene mutation, Anti thrombin III, and antiphospholipid antibodies. --further workup to include JAK2 and PNH testing -- Given the location of the portal vein thrombus that should be acceptable to hold Eliquis therapy for the necessary procedures for his perforated appendix.  Would recommend discontinuation of Eliquis 48 hours prior to the procedure and restarting at the surgeon's discretion when hemostasis has been achieved --Recommend GI evaluation for this hepatomegaly and hepatic steatosis --Continue Eliquis 5 mg twice daily which can be interrupted for surgery.  We will need to continue this for a minimum of 3 months, though the work-up above and GI evaluation may help [place] determine the long-term length of treatment. --Plan to have the patient return to clinic in 3 months time or sooner if the above work-up shows any concerning findings.  Orders Placed This Encounter  Procedures   CBC with Differential ([place] Only)    Standing Status:   Future    Number of Occurrences:   1    Standing Expiration Date:   06/25/2022   CMP ([place] only)    Standing Status:   Future    Number of Occurrences:   1    Standing Expiration Date:   06/25/2022   Jak 2 V617F  ([facility])    Standing Status:   Future    Number of Occurrences:   1    Standing Expiration Date:   06/25/2022   Jak 2 Exon 12 ([facility])    Standing Status:   Future    Number of Occurrences:   1    Standing Expiration Date:   06/25/2022   PNH Profile (High Sensitivity)    Standing Status:   Future  Number of Occurrences:   1    Standing Expiration Date:   06/25/2022   Antithrombin III*    Standing Status:   Future    Number of Occurrences:   1    Standing Expiration Date:   06/25/2022   Cardiolipin antibodies, IgG, IgM, IgA*    Standing Status:   Future    Number of Occurrences:   1    Standing Expiration Date:   06/25/2022   Beta-2-glycoprotein i abs, IgG/M/A    Standing Status:   Future    Number of Occurrences:   1    Standing Expiration Date:   06/25/2022   Factor 5 Leiden*    Standing Status:   Future    Number of Occurrences:   1    Standing Expiration Date:   06/25/2022   Factor 2 assay    Standing Status:   Future    Number of Occurrences:   1    Standing Expiration Date:   06/25/2022   Hepatitis panel, acute    Standing Status:   Future    Number of Occurrences:   1    Standing Expiration Date:   06/25/2022    All questions were answered. The patient knows to call the clinic with any problems, questions or concerns.  A total of more than 60 minutes were spent on this encounter with face-to-face time and non-face-to-face time, including preparing to see the patient, ordering tests and/or medications, counseling the patient and coordination of care as outlined above.   Ledell Peoples, MD Department of Hematology/Oncology [place] [facility] Phone: [phone] Pager: [phone] Email: [email or web address]  06/27/2021 5:08 PM

## 2021-06-26 LAB — FACTOR 2 ASSAY: Factor II Activity: 138 % (ref 50–154)

## 2021-06-26 LAB — CARDIOLIPIN ANTIBODIES, IGG, IGM, IGA
Anticardiolipin IgA: <9 APL U/mL (ref 0–11)
Anticardiolipin IgG: <9 GPL U/mL (ref 0–14)
Anticardiolipin IgM: 12 MPL U/mL (ref 0–12)

## 2021-06-26 LAB — BETA-2-GLYCOPROTEIN I ABS, IGG/M/A
Beta-2 Glyco I IgG: <9 GPI IgG units (ref 0–20)
Beta-2-Glycoprotein I IgA: <9 GPI IgA units (ref 0–25)
Beta-2-Glycoprotein I IgM: 10 GPI IgM units (ref 0–32)

## 2021-06-27 ENCOUNTER — Encounter: Payer: Self-pay | Admitting: Hematology and Oncology

## 2021-06-29 LAB — PNH PROFILE (-HIGH SENSITIVITY)

## 2021-06-30 LAB — FACTOR 5 LEIDEN

## 2021-07-01 LAB — JAK 2 EXON 12 (GENPATH)

## 2021-07-01 LAB — JAK 2 V617F (GENPATH)

## 2021-07-02 ENCOUNTER — Telehealth: Payer: Self-pay | Admitting: *Deleted

## 2021-07-02 ENCOUNTER — Other Ambulatory Visit: Payer: Self-pay | Admitting: Hematology and Oncology

## 2021-07-02 DIAGNOSIS — I81 Portal vein thrombosis: Secondary | ICD-10-CM

## 2021-07-02 NOTE — Telephone Encounter (Signed)
TCT pt's mother, Jana Half. Spoke with her and advised that [facility] lab results did not show any concerning abnormalities.  Dr. Lorenso Courier has put in a referral for [facility] to follow up on any liver abnormalities.  No clear reason for the blood clot other than the appendicitis or an underlying liver disorder. Jana Half voiced understanding.

## 2021-07-02 NOTE — Telephone Encounter (Signed)
-----   Message from Orson Slick, MD sent at 07/02/2021  8:20 AM EDT ----- Please let Juan Valencia know that we did not find any concerning abnormalities in his blood work. There is no evidence of a blood clotting disorder. At this time would recommend referral to GI for his enlarge liver with hepatosteatosis. Cause of the clot is unclear, at this time most likely due to the appendicitis or underlying liver disorder. We will plan to see him back in Oct 2022. Please help facilitate a referral to GI.   ----- Message ----- From: Interface, Lab In [place] Sent: 06/25/2021   3:21 PM EDT To: Orson Slick, MD

## 2021-07-08 ENCOUNTER — Other Ambulatory Visit: Payer: Self-pay | Admitting: *Deleted

## 2021-07-08 ENCOUNTER — Other Ambulatory Visit (HOSPITAL_COMMUNITY): Payer: Self-pay

## 2021-07-08 MED ORDER — APIXABAN 5 MG PO TABS
5.0000 mg | ORAL_TABLET | Freq: Two times a day (BID) | ORAL | 2 refills | Status: DC
  Filled 2021-07-08: qty 60, 30d supply, fill #0
  Filled 2021-08-05: qty 60, 30d supply, fill #1
  Filled 2021-09-14: qty 60, 30d supply, fill #2

## 2021-07-17 ENCOUNTER — Ambulatory Visit: Payer: Self-pay | Admitting: General Surgery

## 2021-07-17 NOTE — H&P (Signed)
Chief Complaint: No chief complaint on file.       History of Present Illness: Juan Valencia is a 20 y.o. male who is seen today for a history of peripheral appendicitis Patient was recently hospitalized secondary to perforated appendicitis on CT scan.  Patient was found to have a fecalith.  Patient was admitted for IV antibiotics.  Patient was transitioned to p.o. antibiotics.   Patient has been doing well from his appendicitis.  He has had no fevers at home.  Patient never did have any abdominal pain even presentation to the hospital.   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  Patient was seen by Dr. Lorenso Courier and underwent treatment with Eliquis.  Patient was okayed to be off his Eliquis for surgery.   .       Review of Systems: A complete review of systems was obtained from the patient.  I have reviewed this information and discussed as appropriate with the patient.  See HPI as well for other ROS.   Review of Systems  Constitutional: Negative for fever.  HENT: Negative for congestion.   Eyes: Negative for blurred vision.  Respiratory: Negative for cough, shortness of breath and wheezing.   Cardiovascular: Negative for chest pain and palpitations.  Gastrointestinal: Negative for heartburn.  Genitourinary: Negative for dysuria.  Musculoskeletal: Negative for myalgias.  Skin: Negative for rash.  Neurological: Negative for dizziness and headaches.  Psychiatric/Behavioral: Negative for depression and suicidal ideas.  All other systems reviewed and are negative.       Medical History: Past Medical History Past Medical History: Diagnosis Date  DVT (deep vein thrombosis) in pregnancy        There is no problem list on file for this patient.     Past Surgical History Past Surgical History: Procedure Laterality Date  JOINT REPLACEMENT Bilateral     Hip surgery x 2  RESECTION LATERAL PHARYNGEAL WALL W/LOCAL ADVANCEMENT FLAP       x 2       Allergies Allergies Allergen Reactions  Vancomycin Swelling     Lip swelling, red and puffy, burning in groin/genital region  Diphenoxylate-Atropine Hives     Possible reaction to Lomotil - 07/25/2020  Acetaminophen Hives  Chlorhexidine Rash  Cold Apap/Non Drowsy [Pseudoephed-Dm-Acetaminophen] Hives     Reaction was to either guaifenesin or dayquil      Current Outpatient Medications on File Prior to Visit Medication Sig Dispense Refill  apixaban (ELIQUIS) 5 mg tablet Take by mouth       No current facility-administered medications on file prior to visit.     Family History History reviewed. No pertinent family history.     Social History   Tobacco Use Smoking Status Never Smoker Smokeless Tobacco Never Used     Social History Social History    Socioeconomic History  Marital status: Single Tobacco Use  Smoking status: Never Smoker  Smokeless tobacco: Never Used Surveyor, mining Use: Never used Substance and Sexual Activity  Alcohol use: Never  Drug use: Never      Objective:     Vitals:   07/17/21 1029 BP: (!) 140/90 Pulse: (!) 131 Temp: 36.8 C (98.2 F) SpO2: 99% Weight: (!) 105.1 kg (231 lb 12.8 oz) Height: 176.5 cm (5' 9.5")   Body mass index is 33.74 kg/m.   Physical Exam Constitutional:      Appearance: Normal appearance.  HENT:     Head: Normocephalic and atraumatic.     Nose: Nose normal.  No congestion.     Mouth/Throat:     Mouth: Mucous membranes are moist.     Pharynx: Oropharynx is clear.  Eyes:     Pupils: Pupils are equal, round, and reactive to light.  Cardiovascular:     Rate and Rhythm: Normal rate and regular rhythm.     Pulses: Normal pulses.     Heart sounds: Normal heart sounds. No murmur heard.   No friction rub. No gallop.  Pulmonary:     Effort: Pulmonary effort is normal. No respiratory distress.     Breath sounds: Normal breath sounds. No stridor. No wheezing, rhonchi or rales.  Abdominal:     General:  Abdomen is flat.  Musculoskeletal:        General: Normal range of motion.     Cervical back: Normal range of motion.  Skin:    General: Skin is warm and dry.  Neurological:     General: No focal deficit present.     Mental Status: He is alert and oriented to person, place, and time.  Psychiatric:        Mood and Affect: Mood normal.        Thought Content: Thought content normal.          Assessment and Plan: Diagnoses and all orders for this visit:   Acute appendicitis with perforation and generalized peritonitis, without abscess, unspecified whether gangrene present       Patient is a 20 year old male with history of perforated appendicitis. Patient was treated with IV antibiotics and discharged with p.o. antibiotics.  Patient is doing well since being discharged.   Had a long discussion with the patient and his mother in regards to interval appendectomy.  I believe secondary to the fact that he has had a appendicolith that was present on CT scan he has a high likelihood of recurrence.  Would recommend proceeding with interval appendectomy. 1.  We will proceed with laparoscopic appendectomy. 2.  Discussed with him the risk benefits of the procedure to include but not limited to: Infection, bleeding, damage surrounding structures, possible need for further surgery.  Patient voiced understanding wished to proceed.   No follow-ups on file.   Ralene Ok, MD

## 2021-07-30 ENCOUNTER — Other Ambulatory Visit: Payer: Self-pay

## 2021-07-30 ENCOUNTER — Encounter: Payer: Self-pay | Admitting: Endocrinology

## 2021-07-30 ENCOUNTER — Ambulatory Visit (INDEPENDENT_AMBULATORY_CARE_PROVIDER_SITE_OTHER): Payer: BC Managed Care – PPO | Admitting: Endocrinology

## 2021-07-30 ENCOUNTER — Other Ambulatory Visit (HOSPITAL_COMMUNITY): Payer: Self-pay

## 2021-07-30 VITALS — BP 100/80 | HR 109 | Ht 69.0 in | Wt 234.8 lb

## 2021-07-30 DIAGNOSIS — E119 Type 2 diabetes mellitus without complications: Secondary | ICD-10-CM | POA: Diagnosis not present

## 2021-07-30 DIAGNOSIS — R7303 Prediabetes: Secondary | ICD-10-CM

## 2021-07-30 LAB — POCT GLYCOSYLATED HEMOGLOBIN (HGB A1C): Hemoglobin A1C: 5.9 % — AB (ref 4.0–5.6)

## 2021-07-30 MED ORDER — ACCU-CHEK GUIDE W/DEVICE KIT
1.0000 | PACK | Freq: Once | 0 refills | Status: AC
  Filled 2021-07-30: qty 1, 90d supply, fill #0

## 2021-07-30 MED ORDER — GLUCOSE BLOOD VI STRP
1.0000 | ORAL_STRIP | Freq: Every day | 3 refills | Status: AC
  Filled 2021-07-30: qty 100, 100d supply, fill #0

## 2021-07-30 NOTE — Progress Notes (Signed)
Subjective:    Patient ID: Juan Valencia, male    DOB: 08-12-2001, 20 y.o.   MRN: [id number]  HPI pt is referred by Dr Owens Shark, for diabetes.  Pt states DM was dx'ed in 2022; he is unaware of any chronic complications; he has never been on medication for DM; pt says his diet is good, but exercise is limited by hip probs; he has never had pancreatitis, pancreatic surgery, severe hypoglycemia or DKA.  He took [facility] for a few mos in 2018, and testosterone x a few months in 2019.    Past Medical History:  Diagnosis Date   ADHD (attention deficit hyperactivity disorder)    Gross motor development delay    PONV (postoperative nausea and vomiting)    Speech delay    Velopharyngeal insufficiency, congenital     Past Surgical History:  Procedure Laterality Date   HARDWARE REMOVAL Bilateral 07/26/2019   Procedure: removal of hardware bilateral hips;  Surgeon: Meredith Pel, MD;  Location: [place];  Service: Orthopedics;  Laterality: Bilateral;   HIP PINNING,CANNULATED Bilateral 05/26/2017   Procedure: BILATERAL CANNULATED HIP PINNING;  Surgeon: Meredith Pel, MD;  Location: [place];  Service: Orthopedics;  Laterality: Bilateral;   PHARYNGEAL FLAP     PHARYNGEAL FLAP REVISION      Social History   Socioeconomic History   Marital status: Single    Spouse name: Not on file   Number of children: Not on file   Years of education: Not on file   Highest education level: Not on file  Occupational History   Not on file  Tobacco Use   Smoking status: Never   Smokeless tobacco: Never  Vaping Use   Vaping Use: Never used  Substance and Sexual Activity   Alcohol use: No   Drug use: No   Sexual activity: Not on file  Other Topics Concern   Not on file  Social History Narrative   Lives with parents, twin brother, younger brother. [employer].       Will attend [employer] in the fall, plans to major in business.    Social Determinants of Health   Financial Resource Strain: Not on  file  Food Insecurity: Not on file  Transportation Needs: Not on file  Physical Activity: Not on file  Stress: Not on file  Social Connections: Not on file  Intimate Partner Violence: Not on file    Current Outpatient Medications on File Prior to Visit  Medication Sig Dispense Refill   albuterol (VENTOLIN HFA) 108 (90 Base) MCG/ACT inhaler Inhale 2 puffs into the lungs every 6 (six) hours as needed for wheezing or shortness of breath. 6.7 g 0   apixaban (ELIQUIS) 5 MG TABS tablet Take 1 tablet (5 mg total) by mouth 2 (two) times daily. Start this when starter pack is finished 60 tablet 2   APIXABAN (ELIQUIS) VTE STARTER PACK ('10MG'$  AND '5MG'$ ) Take as directed on package: start with two-'5mg'$  tablets twice daily for 7 days. On day 8, switch to one-'5mg'$  tablet twice daily. 74 tablet 0   saccharomyces boulardii (FLORASTOR) 250 MG capsule Take 1 capsule (250 mg total) by mouth 2 (two) times daily. 60 capsule 0   No current facility-administered medications on file prior to visit.    Allergies  Allergen Reactions   Vancomycin Swelling    Lip swelling, red and puffy, burning in groin/genital region   Guaifenesin & Derivatives Hives    Reaction was to either guaifenesin or dayquil  Lomotil [Diphenoxylate-Atropine] Hives    Possible reaction to Lomotil - 07/25/2020   Chlorhexidine Rash   Dayquil [Pseudoephedrine-Apap-Dm] Hives    Reaction was to either guaifenesin or dayquil    Tylenol [Acetaminophen] Hives    Family History  Problem Relation Age of Onset   Obesity Mother    Diabetes Father    Tall stature Father    Hypertension Father    Obesity Maternal Grandmother    Hypertension Maternal Grandmother    Diabetes Maternal Grandmother    Heart disease Paternal Grandmother    Diabetes Paternal Grandmother    Cancer Paternal Grandfather        lung    BP 100/80 (BP Location: Right Arm, Patient Position: Sitting, Cuff Size: Normal)   Pulse (!) 109   Ht '5\' 9"'$  (1.753 m)   Wt 234 lb  12.8 oz (106.5 kg)   SpO2 99%   BMI 34.67 kg/m     Review of Systems denies weight loss, sob, n/v, and depression.       Objective:   Physical Exam Pulses: dorsalis pedis intact bilat.   MSK: no deformity of the feet CV: no leg edema Skin:  no ulcer on the feet.  normal color and temp on the feet. Neuro: sensation is intact to touch on the feet Ext: right great toenail is slightly ingrown.   Lab Results  Component Value Date   TSH 1.977 06/12/2021     Lab Results  Component Value Date   CREATININE 0.69 06/25/2021   BUN 13 06/25/2021   NA 141 06/25/2021   K 4.2 06/25/2021   CL 106 06/25/2021   CO2 27 06/25/2021   Lab Results  Component Value Date   HGBA1C 5.9 (A) 07/30/2021    I have reviewed outside records, and summarized: Pt was noted to have elevated A1c, and referred here. He was seen for delayed puberty and short stature, but no cause of either was found.  Both resolved spontaneously    Assessment & Plan:  Type 2 DM: stable.  He declines metformin Obesity: uncontrolled.  I told pt he would have to take metformin first, in order to qualify for GLP rx.   Patient Instructions  good diet and exercise significantly improve the control of your diabetes.  please let me know if you wish to be referred to a dietician.  high blood sugar is very risky to your health.  you should see an eye doctor and dentist every year.  It is very important to get all recommended vaccinations.  Controlling your blood pressure and cholesterol drastically reduces the damage diabetes does to your body.  Those who smoke should quit.  Please discuss these with your doctor.  check your blood sugar once a day.  vary the time of day when you check, between before the 3 meals, and at bedtime.  also check if you have symptoms of your blood sugar being too high or too low.  please keep a record of the readings and bring it to your next appointment here (or you can bring the meter itself).  You can  write it on any piece of paper.  please call us sooner if your blood sugar goes below 70, or if most of your readings are over 200.   Please come back for a follow-up appointment in 3 months.

## 2021-07-30 NOTE — Patient Instructions (Addendum)
good diet and exercise significantly improve the control of your diabetes.  please let me know if you wish to be referred to a dietician.  high blood sugar is very risky to your health.  you should see an eye doctor and dentist every year.  It is very important to get all recommended vaccinations.  Controlling your blood pressure and cholesterol drastically reduces the damage diabetes does to your body.  Those who smoke should quit.  Please discuss these with your doctor.  check your blood sugar once a day.  vary the time of day when you check, between before the 3 meals, and at bedtime.  also check if you have symptoms of your blood sugar being too high or too low.  please keep a record of the readings and bring it to your next appointment here (or you can bring the meter itself).  You can write it on any piece of paper.  please call us sooner if your blood sugar goes below 70, or if most of your readings are over 200.   Please come back for a follow-up appointment in 3 months.

## 2021-08-01 DIAGNOSIS — E119 Type 2 diabetes mellitus without complications: Secondary | ICD-10-CM | POA: Insufficient documentation

## 2021-08-03 ENCOUNTER — Other Ambulatory Visit (HOSPITAL_COMMUNITY): Payer: Self-pay

## 2021-08-04 ENCOUNTER — Other Ambulatory Visit (HOSPITAL_COMMUNITY): Payer: Self-pay

## 2021-08-04 MED ORDER — ACCU-CHEK SOFTCLIX LANCETS MISC
3 refills | Status: AC
  Filled 2021-08-04: qty 100, 90d supply, fill #0

## 2021-08-05 ENCOUNTER — Encounter: Payer: Self-pay | Admitting: Hematology and Oncology

## 2021-08-05 ENCOUNTER — Encounter: Payer: Self-pay | Admitting: Internal Medicine

## 2021-08-05 ENCOUNTER — Other Ambulatory Visit (HOSPITAL_COMMUNITY): Payer: Self-pay

## 2021-08-07 ENCOUNTER — Other Ambulatory Visit (HOSPITAL_COMMUNITY): Payer: Self-pay

## 2021-09-09 ENCOUNTER — Ambulatory Visit (INDEPENDENT_AMBULATORY_CARE_PROVIDER_SITE_OTHER): Payer: BC Managed Care – PPO | Admitting: Cardiology

## 2021-09-09 ENCOUNTER — Other Ambulatory Visit: Payer: Self-pay

## 2021-09-09 ENCOUNTER — Encounter: Payer: Self-pay | Admitting: Cardiology

## 2021-09-09 ENCOUNTER — Ambulatory Visit (INDEPENDENT_AMBULATORY_CARE_PROVIDER_SITE_OTHER): Payer: BC Managed Care – PPO

## 2021-09-09 VITALS — BP 134/70 | HR 135 | Ht 69.0 in | Wt 236.4 lb

## 2021-09-09 DIAGNOSIS — I81 Portal vein thrombosis: Secondary | ICD-10-CM | POA: Diagnosis not present

## 2021-09-09 DIAGNOSIS — R000 Tachycardia, unspecified: Secondary | ICD-10-CM

## 2021-09-09 NOTE — Progress Notes (Signed)
Cardiology CONSULT Note    Date:  09/09/2021   ID:  Juan Valencia, DOB 2001-08-26, MRN [id number]  PCP:  Harrie Jeans, MD  Cardiologist:  Fransico Him, MD   Chief Complaint  Patient presents with   New Patient (Initial Visit)    Tachycardia     History of Present Illness:  Juan Valencia is a 20 y.o. male who is being seen today for the evaluation of Tachycardia at the request of Martyn Malay, MD.  This is a 20yo male with a hx of ADHD, gross motor development delay and tachycardia.  He was recently seen by his PCP .  He was recently hospitalized for N/V and fevers and found to have perforated appendicitis on CT along with portal vein thrombosis and started on Eliquis.  His mom is a Marine scientist and called EMS and his SBP was 16mmHg and HR 200bpm.  On presentation he was tachycardic felt related to dehydration and fever.  EKG on admission showed sinus tachycardia at 167bpm with RVH.   HR normalized during hospitalization and subsequent EKGs showed TWI in V1 through V3.  Recently see by [facility]od[facility] for DM2 and HR found to be 109bpm and now referred for evaluation.   He is here today for followup and is doing well.  He denies any chest pain or pressure, SOB, DOE, PND, orthopnea, LE edema, dizziness, palpitations or syncope. He is compliant with his meds and is tolerating meds with no SE.     Past Medical History:  Diagnosis Date   ADHD (attention deficit hyperactivity disorder)    Gross motor development delay    PONV (postoperative nausea and vomiting)    Speech delay    Tachycardia, unspecified    Velopharyngeal insufficiency, congenital     Past Surgical History:  Procedure Laterality Date   HARDWARE REMOVAL Bilateral 07/26/2019   Procedure: removal of hardware bilateral hips;  Surgeon: Meredith Pel, MD;  Location: [place];  Service: Orthopedics;  Laterality: Bilateral;   HIP PINNING,CANNULATED Bilateral 05/26/2017   Procedure: BILATERAL CANNULATED HIP PINNING;  Surgeon: Meredith Pel, MD;  Location: [place];  Service: Orthopedics;  Laterality: Bilateral;   PHARYNGEAL FLAP     PHARYNGEAL FLAP REVISION      Current Medications: Current Meds  Medication Sig   Accu-Chek Softclix Lancets lancets USE AS DIRECTED TO TEST GLUCOSE LEVELS ONCE DAILY.   apixaban (ELIQUIS) 5 MG TABS tablet Take 1 tablet (5 mg total) by mouth 2 (two) times daily. Start this when starter pack is finished   APIXABAN (ELIQUIS) VTE STARTER PACK (10MG  AND 5MG ) Take as directed on package: start with two-5mg  tablets twice daily for 7 days. On day 8, switch to one-5mg  tablet twice daily.   glucose blood test strip Use to test blood glucose levels once daily.    Allergies:   Vancomycin, Guaifenesin & derivatives, Lomotil [diphenoxylate-atropine], Chlorhexidine, Dayquil [pseudoephedrine-apap-dm], and Tylenol [acetaminophen]   Social History   Socioeconomic History   Marital status: Single    Spouse name: Not on file   Number of children: Not on file   Years of education: Not on file   Highest education level: Not on file  Occupational History   Not on file  Tobacco Use   Smoking status: Never   Smokeless tobacco: Never  Vaping Use   Vaping Use: Never used  Substance and Sexual Activity   Alcohol use: No   Drug use: No   Sexual activity: Not on file  Other Topics Concern   Not on file  Social History Narrative   Lives with parents, twin brother, younger brother. [employer].       Will attend [employer] in the fall, plans to major in business.    Social Determinants of Health   Financial Resource Strain: Not on file  Food Insecurity: Not on file  Transportation Needs: Not on file  Physical Activity: Not on file  Stress: Not on file  Social Connections: Not on file     Family History:  The patient's family history includes Cancer in his paternal grandfather; Diabetes in his father, maternal grandmother, and paternal grandmother; Heart disease in his paternal  grandmother; Hypertension in his father and maternal grandmother; Obesity in his maternal grandmother and mother; Tall stature in his father.   ROS:   Please see the history of present illness.    ROS All other systems reviewed and are negative.  No flowsheet data found.     PHYSICAL EXAM:   VS:  BP 134/70   Pulse (!) 135   Ht 5\' 9"  (1.753 m)   Wt 236 lb 6.4 oz (107.2 kg)   SpO2 98%   BMI 34.91 kg/m    GEN: Well nourished, well developed, in no acute distress  HEENT: normal  Neck: no JVD, carotid bruits, or masses Cardiac: Regular and tachy; no murmurs, rubs, or gallops,no edema.  Intact distal pulses bilaterally.  Respiratory:  clear to auscultation bilaterally, normal work of breathing GI: soft, nontender, nondistended, + BS MS: no deformity or atrophy  Skin: warm and dry, no rash Neuro:  Alert and Oriented x 3, Strength and sensation are intact Psych: euthymic mood, full affect  Wt Readings from Last 3 Encounters:  09/09/21 236 lb 6.4 oz (107.2 kg) (99 %, Z= 2.19)*  07/30/21 234 lb 12.8 oz (106.5 kg) (99 %, Z= 2.17)*  06/25/21 224 lb 1.6 oz (101.7 kg) (98 %, Z= 1.98)*   * Growth percentiles are based on CDC (Boys, 2-20 Years) data.      Studies/Labs Reviewed:   EKG:  EKG is ordered today.  The ekg ordered today demonstrates sinus tachycardia at 135bpm with RVH and TWI in V1-V3  Recent Labs: 06/12/2021: TSH 1.977 06/25/2021: ALT 53; BUN 13; Creatinine 0.69; Hemoglobin 12.0; Platelet Count 257; Potassium 4.2; Sodium 141   Lipid Panel    Component Value Date/Time   TRIG 78 07/26/2020 1423   Additional studies/ records that were reviewed today include:  Hospital notes    ASSESSMENT:    1. Sinus tachycardia   2. Thrombosis, portal vein      PLAN:  In order of problems listed above:  Tachycardia -sinus in origin and first noted in setting of N/V/fever and dehydration from perforated appendicitis and portal vein thrombosis -he is afebrile and remains  tachycardic at 135bpm>>? POTS vs. Anxiety -he has an EKG from 07/2020 showing sinus tachycardia in the 160's and RVH when he had COVID 19 -his mom says that his HR was in the 70's in the car and then shot up when he got into the office -I will get a 2 week Ziopatch to assess average HR -TSH normal in July -SG was normal on UA -check urine Na -check 2D echo -He tries to avoid caffeine>>he drinks soda 1-2 times weekly and decaffeinated coffee -He has been on an Albuterol inhaler in the past but has not used it -encouraged him to drink at least 64oz of fluids daily  2.  Portal Vein thrombosis -non occlussive and on Eliquis for 3 months per Hematology  3.  Perforated appendicitis -dx a few weeks ago -treated conservatively until finished DOAC for portal vein thrombosis -per surgery  Time Spent: 25 minutes total time of encounter, including 15 minutes spent in face-to-face patient care on the date of this encounter. This time includes coordination of care and counseling regarding above mentioned problem list. Remainder of non-face-to-face time involved reviewing chart documents/testing relevant to the patient encounter and documentation in the medical record. I have independently reviewed documentation from referring provider  Medication Adjustments/Labs and Tests Ordered: Current medicines are reviewed at length with the patient today.  Concerns regarding medicines are outlined above.  Medication changes, Labs and Tests ordered today are listed in the Patient Instructions below.  There are no Patient Instructions on file for this visit.   Signed, Fransico Him, MD  09/09/2021 1:34 PM    [facility] [address], [place], [place]  [address] Phone: [phone]; Fax: [phone]

## 2021-09-09 NOTE — Addendum Note (Signed)
Addended by: Antonieta Iba on: 09/09/2021 02:10 PM   Modules accepted: Orders

## 2021-09-09 NOTE — Addendum Note (Signed)
Addended by: Antonieta Iba on: 09/09/2021 02:48 PM   Modules accepted: Orders

## 2021-09-09 NOTE — Patient Instructions (Addendum)
Medication Instructions:  Your physician recommends that you continue on your current medications as directed. Please refer to the Current Medication list given to you today.  *If you need a refill on your cardiac medications before your next appointment, please call your pharmacy*   Lab Work: TODAY: Urine NA/osmolality   If you have labs (blood work) drawn today and your tests are completely normal, you will receive your results only by: [place] (if you have MyChart) OR A paper copy in the mail If you have any lab test that is abnormal or we need to change your treatment, we will call you to review the results.   Testing/Procedures: Your physician has requested that you have an echocardiogram. Echocardiography is a painless test that uses sound waves to create images of your heart. It provides your doctor with information about the size and shape of your heart and how well your heart's chambers and valves are working. This procedure takes approximately one hour. There are no restrictions for this procedure.  Your physician has recommended that you wear an event monitor. Event monitors are medical devices that record the heart's electrical activity. Doctors most often [place] these monitors to diagnose arrhythmias. Arrhythmias are problems with the speed or rhythm of the heartbeat. The monitor is a small, portable device. You can wear one while you do your normal daily activities. This is usually used to diagnose what is causing palpitations/syncope (passing out).    Follow-Up: At [facility], you and your health needs are our priority.  As part of our continuing mission to provide you with exceptional heart care, we have created designated Provider Care Teams.  These Care Teams include your primary Cardiologist (physician) and Advanced Practice Providers (APPs -  Physician Assistants and Nurse Practitioners) who all work together to provide you with the care you need, when you need  it.  Your next appointment:   1 year(s)  The format for your next appointment:   In Person  Provider:   You may see Fransico Him, MD or one of the following Advanced Practice Providers on your designated Care Team:   Melina Copa, PA-C Ermalinda Barrios, PA-C   Other Instructions >Avoid caffeine  >Liberalize your salt intake >Drink at least 64 ounces of non-caffeinated fluids daily   ZIO XT- Long Term Monitor Instructions  Your physician has requested you wear a ZIO patch monitor for 14 days.  This is a single patch monitor. [employer] supplies one patch monitor per enrollment. Additional stickers are not available. Please do not apply patch if you will be having a Nuclear Stress Test,  Echocardiogram, Cardiac CT, MRI, or Chest Xray during the period you would be wearing the  monitor. The patch cannot be worn during these tests. You cannot remove and re-apply the  ZIO XT patch monitor.  Your ZIO patch monitor will be mailed 3 day USPS to your address on file. It may take 3-5 days  to receive your monitor after you have been enrolled.  Once you have received your monitor, please review the enclosed instructions. Your monitor  has already been registered assigning a specific monitor serial # to you.  Billing and Patient Assistance Program Information  We have supplied [employer] with any of your insurance information on file for billing purposes. [employer] offers a sliding scale Patient Assistance Program for patients that do not have  insurance, or whose insurance does not completely cover the cost of the ZIO monitor.  You must apply for the Patient  Assistance Program to qualify for this discounted rate.  To apply, please call [employer] at [phone], select option 4, select option 2, ask to apply for  Patient Assistance Program. Theodore Demark will ask your household income, and how many people  are in your household. They will quote your out-of-pocket cost based on that information.  [employer]  will also be able to set up a 73-month, interest-free payment plan if needed.  Applying the monitor   Shave hair from upper left chest.  Hold abrader disc by orange tab. Rub abrader in 40 strokes over the upper left chest as  indicated in your monitor instructions.  Clean area with 4 enclosed alcohol pads. Let dry.  Apply patch as indicated in monitor instructions. Patch will be placed under collarbone on left  side of chest with arrow pointing upward.  Rub patch adhesive wings for 2 minutes. Remove white label marked "1". Remove the white  label marked "2". Rub patch adhesive wings for 2 additional minutes.  While looking in a mirror, press and release button in center of patch. A small green light will  flash 3-4 times. This will be your only indicator that the monitor has been turned on.  Do not shower for the first 24 hours. You may shower after the first 24 hours.  Press the button if you feel a symptom. You will hear a small click. Record Date, Time and  Symptom in the Patient Logbook.  When you are ready to remove the patch, follow instructions on the last 2 pages of Patient  Logbook. Stick patch monitor onto the last page of Patient Logbook.  Place Patient Logbook in the blue and white box. Use locking tab on box and tape box closed  securely. The blue and white box has prepaid postage on it. Please place it in the mailbox as  soon as possible. Your physician should have your test results approximately 7 days after the  monitor has been mailed back to [facility].  Call [facility] at [phone] if you have questions regarding  your ZIO XT patch monitor. Call them immediately if you see an orange light blinking on your  monitor.  If your monitor falls off in less than 4 days, contact our Monitor department at [phone].  If your monitor becomes loose or falls off after 4 days call [employer] at [phone] for  suggestions on securing your monitor

## 2021-09-09 NOTE — Progress Notes (Unsigned)
Patient enrolled for [employer] to mail a 14 day ZIO XT monitor to his address on file.

## 2021-09-10 LAB — SODIUM, URINE, RANDOM: Sodium, Ur: 87 mmol/L

## 2021-09-10 LAB — OSMOLALITY, URINE: Osmolality, Ur: 345 mOsmol/kg

## 2021-09-14 ENCOUNTER — Other Ambulatory Visit (HOSPITAL_COMMUNITY): Payer: Self-pay

## 2021-09-15 ENCOUNTER — Telehealth: Payer: Self-pay

## 2021-09-15 DIAGNOSIS — R000 Tachycardia, unspecified: Secondary | ICD-10-CM

## 2021-09-15 DIAGNOSIS — I81 Portal vein thrombosis: Secondary | ICD-10-CM

## 2021-09-15 NOTE — Telephone Encounter (Signed)
The patient has been notified of the result and verbalized understanding.  All questions (if any) were answered. Juan Iba, RN 09/15/2021 3:40 PM  He will come in next Tuesday AM for cortisol level.

## 2021-09-15 NOTE — Telephone Encounter (Signed)
-----   Message from Sueanne Margarita, MD sent at 09/13/2021  1:58 PM EDT ----- Urine osmolality normal.  Urine Na a little high - please have him come in for 8am serum cortisol level

## 2021-09-16 ENCOUNTER — Telehealth: Payer: Self-pay | Admitting: Cardiology

## 2021-09-16 NOTE — Telephone Encounter (Signed)
Patient's mother calling to see if the patient can just get labs done for pre admission instead of coming into the office Tuesday. She says he would end up having to get lab work 3 times in 2 days. She says he is already getting labs drawn Tuesday for pre admission and wants to know if they can draw the labs requested from the office as well. Phone: [phone]

## 2021-09-16 NOTE — Telephone Encounter (Signed)
Spoke with the patient's mother who is requesting to have the patient's cortisol drawn when he gets pre-admission lab work on Tuesday. I advised her if their office is willing to draw the labs than that is okay and I would send orders over. She will call and let us know.

## 2021-09-17 NOTE — Progress Notes (Addendum)
COVID swab appointment:  N/A  COVID Vaccine Completed:  No Date COVID Vaccine completed: Has received booster: COVID vaccine manufacturer: [employer]   Date of COVID positive in last 90 days:  No  PCP - Harrie Jeans, MD Cardiologist - Fransico Him, MD  Chest x-ray - 06-12-21 Epic EKG - 09-09-21 Epic Stress Test - N/A ECHO - 09-18-21 Epic Cardiac Cath - N/A Pacemaker/ICD device last checked: Spinal Cord Stimulator: Long Term Monitor - 09-09-21 Epic  Sleep Study - N/A CPAP -   Fasting Blood Sugar - Less than 120 Checks Blood Sugar - 1x daily  Blood Thinner Instructions:  Eliquis 5 mg.  Hold x2 days.  Last dose 09-20-21 Aspirin Instructions: Last Dose:  Activity level:  Can go up a flight of stairs and perform activities of daily living without stopping and without symptoms of chest pain or shortness of breath.    Anesthesia review:  Sinus tachycardia, portal  vein thrombosis, DM  Patient denies shortness of breath, fever, cough and chest pain at PAT appointment   Patient verbalized understanding of instructions that were given to them at the PAT appointment. Patient was also instructed that they will need to review over the PAT instructions again at home before surgery.

## 2021-09-17 NOTE — Patient Instructions (Addendum)
DUE TO COVID-19 ONLY ONE VISITOR IS ALLOWED TO COME WITH YOU AND STAY IN THE WAITING ROOM ONLY DURING PRE OP AND PROCEDURE.   **NO VISITORS ARE ALLOWED IN THE SHORT STAY AREA OR RECOVERY ROOM!!**         Your procedure is scheduled on:  Wednesday, 09-23-21   Report to [facility] Main  Entrance     Report to admitting at 6:45 AM   Call this number if you have problems the morning of surgery [phone]   Do not eat food :After Midnight.   May have liquids until 6:00 AM day of surgery  CLEAR LIQUID DIET  Foods Allowed                                                                     Foods Excluded  Water, Black Coffee (no milk/no creamer) and tea, regular and decaf                              liquids that you cannot  Plain Jell-O in any flavor  (No red)                         see through such as: Fruit ices (not with fruit pulp)                                 milk, soups, orange juice  Iced Popsicles (No red)                                    All solid food                             Apple juices Sports drinks like Gatorade (No red) Lightly seasoned clear broth or consume(fat free) Sugar    Complete one G2 drink the morning of surgery at 6:00 AM the day of surgery.     The day of surgery:  Drink ONE (1) Pre-Surgery G2 the morning of surgery. Drink in one sitting. Do not sip.  This drink was given to you during your hospital  pre-op appointment visit. Nothing else to drink after completing the Pre-Surgery G2.          If you have questions, please contact your surgeon's office.     Oral Hygiene is also important to reduce your risk of infection.                                    Remember - BRUSH YOUR TEETH THE MORNING OF SURGERY WITH YOUR REGULAR TOOTHPASTE   Do NOT smoke after Midnight   Take these medicines the morning of surgery with A SIP OF WATER: None                    Eliquis - last dose 09-19-21   Stop all vitamins and herbal supplements a  week before  surgery             You may not have any metal on your body including jewelry, and body piercing             Do not wear  lotions, powders, cologne, or deodorant              Men may shave face and neck.  Do not bring valuables to the hospital. [place].   Contacts, dentures or bridgework may not be worn into surgery.    Patients discharged the day of surgery will not be allowed to drive home.  Please read over the following fact sheets you were given: IF YOU HAVE QUESTIONS ABOUT YOUR PRE OP INSTRUCTIONS PLEASE CALL [facility] - Preparing for Surgery Before surgery, you can play an important role.  Because skin is not sterile, your skin needs to be as free of germs as possible.  You can reduce the number of germs on your skin by washing with an antibacterial soap before surgery.  Do not shave (including legs and underarms) for at least 48 hours prior to surgery.  You may shave your face/neck.  Please follow these instructions carefully:  1.  Shower with an antibacterial Soap the night before surgery and the  morning of surgery.  2.  If you choose to wash your hair, wash your hair first as usual with your normal  shampoo.  3.  After you shampoo, rinse your hair and body thoroughly to remove the shampoo.    4.   Wash thoroughly, paying special attention to the area where your    surgery  will be performed.  5.  Thoroughly rinse your body with warm water from the neck down.  6.   Pat yourself dry with a clean towel.             7.  Wear clean pajamas.             8.  Place clean sheets on your bed the night of your first shower and do not  sleep with pets. Day of Surgery : Do not apply any lotions/deodorants the morning of surgery.  Please wear clean clothes to the hospital/surgery center.  FAILURE TO FOLLOW THESE INSTRUCTIONS MAY RESULT IN THE CANCELLATION OF YOUR SURGERY  PATIENT  SIGNATURE_________________________________  NURSE SIGNATURE__________________________________  ________________________________________________________________________

## 2021-09-18 ENCOUNTER — Other Ambulatory Visit (HOSPITAL_BASED_OUTPATIENT_CLINIC_OR_DEPARTMENT_OTHER): Payer: BC Managed Care – PPO

## 2021-09-18 ENCOUNTER — Ambulatory Visit (INDEPENDENT_AMBULATORY_CARE_PROVIDER_SITE_OTHER): Payer: BC Managed Care – PPO

## 2021-09-18 ENCOUNTER — Other Ambulatory Visit: Payer: Self-pay

## 2021-09-18 DIAGNOSIS — R000 Tachycardia, unspecified: Secondary | ICD-10-CM

## 2021-09-18 DIAGNOSIS — I81 Portal vein thrombosis: Secondary | ICD-10-CM | POA: Diagnosis not present

## 2021-09-18 LAB — ECHOCARDIOGRAM COMPLETE
Area-P 1/2: 3.28 cm2
Calc EF: 48.8 %
S' Lateral: 3.02 cm
Single Plane A2C EF: 43.9 %
Single Plane A4C EF: 51.9 %

## 2021-09-21 ENCOUNTER — Other Ambulatory Visit: Payer: Self-pay

## 2021-09-21 ENCOUNTER — Inpatient Hospital Stay: Payer: BC Managed Care – PPO | Attending: Hematology and Oncology

## 2021-09-21 ENCOUNTER — Inpatient Hospital Stay (HOSPITAL_BASED_OUTPATIENT_CLINIC_OR_DEPARTMENT_OTHER): Payer: BC Managed Care – PPO | Admitting: Hematology and Oncology

## 2021-09-21 ENCOUNTER — Other Ambulatory Visit: Payer: Self-pay | Admitting: Hematology and Oncology

## 2021-09-21 VITALS — BP 124/63 | HR 68 | Temp 97.5°F | Resp 18 | Wt 237.2 lb

## 2021-09-21 DIAGNOSIS — R160 Hepatomegaly, not elsewhere classified: Secondary | ICD-10-CM | POA: Diagnosis not present

## 2021-09-21 DIAGNOSIS — Z801 Family history of malignant neoplasm of trachea, bronchus and lung: Secondary | ICD-10-CM | POA: Insufficient documentation

## 2021-09-21 DIAGNOSIS — I81 Portal vein thrombosis: Secondary | ICD-10-CM

## 2021-09-21 DIAGNOSIS — Z7901 Long term (current) use of anticoagulants: Secondary | ICD-10-CM | POA: Diagnosis not present

## 2021-09-21 DIAGNOSIS — K760 Fatty (change of) liver, not elsewhere classified: Secondary | ICD-10-CM | POA: Diagnosis not present

## 2021-09-21 DIAGNOSIS — K3532 Acute appendicitis with perforation and localized peritonitis, without abscess: Secondary | ICD-10-CM | POA: Diagnosis not present

## 2021-09-21 DIAGNOSIS — E119 Type 2 diabetes mellitus without complications: Secondary | ICD-10-CM | POA: Insufficient documentation

## 2021-09-21 DIAGNOSIS — K631 Perforation of intestine (nontraumatic): Secondary | ICD-10-CM | POA: Diagnosis not present

## 2021-09-21 LAB — CBC WITH DIFFERENTIAL (CANCER CENTER ONLY)
Abs Immature Granulocytes: 0.04 10*3/uL (ref 0.00–0.07)
Basophils Absolute: 0 10*3/uL (ref 0.0–0.1)
Basophils Relative: 0 %
Eosinophils Absolute: 0.1 10*3/uL (ref 0.0–0.5)
Eosinophils Relative: 1 %
HCT: 37.1 % — ABNORMAL LOW (ref 39.0–52.0)
Hemoglobin: 12.4 g/dL — ABNORMAL LOW (ref 13.0–17.0)
Immature Granulocytes: 0 %
Lymphocytes Relative: 43 %
Lymphs Abs: 4 10*3/uL (ref 0.7–4.0)
MCH: 27.3 pg (ref 26.0–34.0)
MCHC: 33.4 g/dL (ref 30.0–36.0)
MCV: 81.7 fL (ref 80.0–100.0)
Monocytes Absolute: 0.6 10*3/uL (ref 0.1–1.0)
Monocytes Relative: 6 %
Neutro Abs: 4.6 10*3/uL (ref 1.7–7.7)
Neutrophils Relative %: 50 %
Platelet Count: 260 10*3/uL (ref 150–400)
RBC: 4.54 MIL/uL (ref 4.22–5.81)
RDW: 13.2 % (ref 11.5–15.5)
WBC Count: 9.4 10*3/uL (ref 4.0–10.5)
nRBC: 0 % (ref 0.0–0.2)

## 2021-09-21 LAB — CMP (CANCER CENTER ONLY)
ALT: 42 U/L (ref 0–44)
AST: 18 U/L (ref 15–41)
Albumin: 4.2 g/dL (ref 3.5–5.0)
Alkaline Phosphatase: 112 U/L (ref 38–126)
Anion gap: 9 (ref 5–15)
BUN: 12 mg/dL (ref 6–20)
CO2: 26 mmol/L (ref 22–32)
Calcium: 9.5 mg/dL (ref 8.9–10.3)
Chloride: 108 mmol/L (ref 98–111)
Creatinine: 0.71 mg/dL (ref 0.61–1.24)
GFR, Estimated: >60 mL/min (ref >60)
Glucose, Bld: 96 mg/dL (ref 70–99)
Potassium: 3.7 mmol/L (ref 3.5–5.1)
Sodium: 143 mmol/L (ref 135–145)
Total Bilirubin: 0.3 mg/dL (ref 0.3–1.2)
Total Protein: 7.5 g/dL (ref 6.5–8.1)

## 2021-09-21 NOTE — Progress Notes (Signed)
[name]   [phone]  PROGRESS NOTE  Patient Care Team: Harrie Jeans, MD as PCP - General (Pediatrics) Sueanne Margarita, MD as PCP - Cardiology (Cardiology) Ottis Stain, MD as Pediatrician (Pediatrics)  Hematological/Oncological History # Portal Vein Thrombosis of Unclear Etiology 06/12/2021: patient presented to the ED with tachycardia and fever for 2-week period.  CT abdomen showed findings consistent with a perforated appendix.  Additionally there was noted to be a nonocclusive thrombus in the portal vein just beyond the portal mesenteric confluence.  There was notable hepatomegaly and hepatic steatosis.  CT angio study of the chest did not reveal any signs of pulmonary embolism 06/25/2021: establish care with Dr. Lorenso Courier  Interval History:  Juan Valencia 20 y.o. male with medical history significant for a portal vein thrombosis of unclear etiology who presents for a follow up visit. The patient's last visit was on 06/25/2021. In the interim since the last visit he is continued on his Eliquis therapy without difficulty.  On exam today [name] is accompanied by his mother.  He reports he has felt well with no difficulties with the blood thinner.  He notes he is not having any bleeding, bruising, or dark stools.  His weight is stable and his appetite has been good.  He denies any abdominal pain.  He is concerned about a possible ingrown toenail on his left great toe.  He is also doing his best to get his diabetes under control and his last A1c was 5.9.  He is currently scheduled for surgery on Wednesday, 09/23/2021 for appendectomy.  He notes he stopped his blood thinner yesterday preparation for this procedure.  He currently denies any fevers, chills, sweats, nausea, vomiting or diarrhea.  Full 10 point ROS is listed below.  Of note [name] is scheduled to see gastroenterology in mid November.  MEDICAL HISTORY:  Past Medical History:   Diagnosis Date   ADHD (attention deficit hyperactivity disorder)    Gross motor development delay    PONV (postoperative nausea and vomiting)    Speech delay    Tachycardia, unspecified    Velopharyngeal insufficiency, congenital     SURGICAL HISTORY: Past Surgical History:  Procedure Laterality Date   HARDWARE REMOVAL Bilateral 07/26/2019   Procedure: removal of hardware bilateral hips;  Surgeon: Meredith Pel, MD;  Location: [place];  Service: Orthopedics;  Laterality: Bilateral;   HIP PINNING,CANNULATED Bilateral 05/26/2017   Procedure: BILATERAL CANNULATED HIP PINNING;  Surgeon: Meredith Pel, MD;  Location: [place];  Service: Orthopedics;  Laterality: Bilateral;   PHARYNGEAL FLAP     PHARYNGEAL FLAP REVISION      SOCIAL HISTORY: Social History   Socioeconomic History   Marital status: Single    Spouse name: Not on file   Number of children: Not on file   Years of education: Not on file   Highest education level: Not on file  Occupational History   Not on file  Tobacco Use   Smoking status: Never   Smokeless tobacco: Never  Vaping Use   Vaping Use: Never used  Substance and Sexual Activity   Alcohol use: No   Drug use: No   Sexual activity: Not on file  Other Topics Concern   Not on file  Social History Narrative   Lives with parents, twin brother, younger brother. [employer].       Will attend [employer] in the fall, plans to major in business.  Social Determinants of Health   Financial Resource Strain: Not on file  Food Insecurity: Not on file  Transportation Needs: Not on file  Physical Activity: Not on file  Stress: Not on file  Social Connections: Not on file  Intimate Partner Violence: Not on file    FAMILY HISTORY: Family History  Problem Relation Age of Onset   Obesity Mother    Diabetes Father    Tall stature Father    Hypertension Father    Obesity Maternal Grandmother    Hypertension Maternal Grandmother    Diabetes  Maternal Grandmother    Heart disease Paternal Grandmother    Diabetes Paternal Grandmother    Cancer Paternal Grandfather        lung    ALLERGIES:  is allergic to vancomycin, guaifenesin & derivatives, lomotil [diphenoxylate-atropine], chlorhexidine, dayquil [pseudoephedrine-apap-dm], and tylenol [acetaminophen].  MEDICATIONS:  Current Outpatient Medications  Medication Sig Dispense Refill   apixaban (ELIQUIS) 5 MG TABS tablet Take 1 tablet (5 mg total) by mouth 2 (two) times daily. Start this when starter pack is finished 60 tablet 2   Accu-Chek Softclix Lancets lancets USE AS DIRECTED TO TEST GLUCOSE LEVELS ONCE DAILY. 100 each 3   albuterol (VENTOLIN HFA) 108 (90 Base) MCG/ACT inhaler Inhale 2 puffs into the lungs every 6 (six) hours as needed for wheezing or shortness of breath. (Patient not taking: No sig reported) 6.7 g 0   glucose blood test strip Use to test blood glucose levels once daily. 100 each 3   No current facility-administered medications for this visit.    REVIEW OF SYSTEMS:   Constitutional: ( - ) fevers, ( - )  chills , ( - ) night sweats Eyes: ( - ) blurriness of vision, ( - ) double vision, ( - ) watery eyes Ears, nose, mouth, throat, and face: ( - ) mucositis, ( - ) sore throat Respiratory: ( - ) cough, ( - ) dyspnea, ( - ) wheezes Cardiovascular: ( - ) palpitation, ( - ) chest discomfort, ( - ) lower extremity swelling Gastrointestinal:  ( - ) nausea, ( - ) heartburn, ( - ) change in bowel habits Skin: ( - ) abnormal skin rashes Lymphatics: ( - ) new lymphadenopathy, ( - ) easy bruising Neurological: ( - ) numbness, ( - ) tingling, ( - ) new weaknesses Behavioral/Psych: ( - ) mood change, ( - ) new changes  All other systems were reviewed with the patient and are negative.  PHYSICAL EXAMINATION: ECOG PERFORMANCE STATUS: 0 - Asymptomatic  Vitals:   09/21/21 1456  BP: 124/63  Pulse: 68  Resp: 18  Temp: (!) 97.5 F (36.4 C)  SpO2: 100%   Filed  Weights   09/21/21 1456  Weight: 237 lb 4 oz (107.6 kg)    GENERAL: Well-appearing young Caucasian male, alert, no distress and comfortable SKIN: skin color, texture, turgor are normal, no rashes or significant lesions EYES: conjunctiva are pink and non-injected, sclera clear LUNGS: clear to auscultation and percussion with normal breathing effort HEART: regular rate & rhythm and no murmurs and no lower extremity edema Musculoskeletal: no cyanosis of digits and no clubbing  PSYCH: alert & oriented x 3, fluent speech NEURO: no focal motor/sensory deficits  LABORATORY DATA:  I have reviewed the data as listed CBC Latest Ref Rng & Units 09/21/2021 06/25/2021 06/15/2021  WBC 4.0 - 10.5 K/uL 9.4 10.2 11.8(H)  Hemoglobin 13.0 - 17.0 g/dL 12.4(L) 12.0(L) 10.8(L)  Hematocrit 39.0 - 52.0 % 37.1(L)  37.3(L) 34.1(L)  Platelets 150 - 400 K/uL 260 257 229    CMP Latest Ref Rng & Units 09/21/2021 06/25/2021 06/15/2021  Glucose 70 - 99 mg/dL 96 99 125(H)  BUN 6 - 20 mg/dL 12 13 6   Creatinine 0.61 - 1.24 mg/dL 0.71 0.69 0.57(L)  Sodium 135 - 145 mmol/L 143 141 139  Potassium 3.5 - 5.1 mmol/L 3.7 4.2 3.9  Chloride 98 - 111 mmol/L 108 106 107  CO2 22 - 32 mmol/L 26 27 26   Calcium 8.9 - 10.3 mg/dL 9.5 9.4 8.4(L)  Total Protein 6.5 - 8.1 g/dL 7.5 7.8 5.6(L)  Total Bilirubin 0.3 - 1.2 mg/dL 0.3 0.5 0.7  Alkaline Phos 38 - 126 U/L 112 102 85  AST 15 - 41 U/L 18 28 29   ALT 0 - 44 U/L 42 53(H) 72(H)    RADIOGRAPHIC STUDIES: ECHOCARDIOGRAM COMPLETE  Result Date: 09/18/2021    ECHOCARDIOGRAM REPORT   Patient Name:   MCCALL LOMAX Date of Exam: 09/18/2021 Medical Rec #:  [id number]    Height:       69.0 in Accession #:    [id number]   Weight:       236.4 lb Date of Birth:  March 01, 2001    BSA:          2.218 m Patient Age:    20 years     BP:           134/70 mmHg Patient Gender: M            HR:           108 bpm. Exam Location:  Outpatient Procedure: 2D Echo, Color Doppler, Cardiac Doppler and Strain Analysis  Indications:    R00.0 (ICD-10-CM) - Sinus tachycardia                 I81 (ICD-10-CM) - Thrombosis, portal vein  History:        Patient has no prior history of Echocardiogram examinations.                 Risk Factors:Diabetes and Non-Smoker. Covid 19, Perforated                 Appendicitis, Thrombosis Portal Vein.  Sonographer:    Leavy Cella RDCS Referring Phys: [place]  1. Left ventricular ejection fraction, by estimation, is 50 to 55%. The left ventricle has low normal function. The left ventricle has no regional wall motion abnormalities. Left ventricular diastolic parameters were normal.  2. Right ventricular systolic function is normal. The right ventricular size is normal. Tricuspid regurgitation signal is inadequate for assessing PA pressure.  3. The mitral valve is normal in structure. No evidence of mitral valve regurgitation. No evidence of mitral stenosis.  4. The aortic valve is normal in structure. Aortic valve regurgitation is not visualized. No aortic stenosis is present.  5. The inferior vena cava is normal in size with greater than 50% respiratory variability, suggesting right atrial pressure of 3 mmHg. Comparison(s): No prior Echocardiogram. FINDINGS  Left Ventricle: Left ventricular ejection fraction, by estimation, is 50 to 55%. The left ventricle has low normal function. The left ventricle has no regional wall motion abnormalities. The left ventricular internal cavity size was normal in size. There is no left ventricular hypertrophy. Left ventricular diastolic parameters were normal. Right Ventricle: The right ventricular size is normal. No increase in right ventricular wall thickness. Right ventricular systolic function is normal. Tricuspid regurgitation signal is inadequate  for assessing PA pressure. Left Atrium: Left atrial size was normal in size. Right Atrium: Right atrial size was normal in size. Pericardium: There is no evidence of pericardial effusion.  Presence of pericardial fat pad. Mitral Valve: The mitral valve is normal in structure. No evidence of mitral valve regurgitation. No evidence of mitral valve stenosis. Tricuspid Valve: The tricuspid valve is normal in structure. Tricuspid valve regurgitation is not demonstrated. No evidence of tricuspid stenosis. Aortic Valve: The aortic valve is normal in structure. Aortic valve regurgitation is not visualized. No aortic stenosis is present. Pulmonic Valve: The pulmonic valve was normal in structure. Pulmonic valve regurgitation is not visualized. No evidence of pulmonic stenosis. Aorta: The aortic root is normal in size and structure. Venous: The inferior vena cava is normal in size with greater than 50% respiratory variability, suggesting right atrial pressure of 3 mmHg. IAS/Shunts: No atrial level shunt detected by color flow Doppler.  LEFT VENTRICLE PLAX 2D LVIDd:         4.34 cm      Diastology LVIDs:         3.02 cm      LV e' medial:    12.85 cm/s LV PW:         1.06 cm      LV E/e' medial:  6.4 LV IVS:        0.79 cm      LV e' lateral:   12.85 cm/s LVOT diam:     2.10 cm      LV E/e' lateral: 6.4 LVOT Area:     3.46 cm                             2D Longitudinal Strain                             2D Strain GLS Avg:     -11.7 % LV Volumes (MOD) LV vol d, MOD A2C: 119.0 ml LV vol d, MOD A4C: 122.0 ml LV vol s, MOD A2C: 66.7 ml LV vol s, MOD A4C: 58.7 ml LV SV MOD A2C:     52.3 ml LV SV MOD A4C:     122.0 ml LV SV MOD BP:      59.4 ml RIGHT VENTRICLE RV Basal diam:  3.82 cm RV Mid diam:    2.84 cm RV S prime:     14.00 cm/s TAPSE (M-mode): 1.1 cm LEFT ATRIUM             Index        RIGHT ATRIUM          Index LA diam:        3.80 cm 1.71 cm/m   RA Area:     9.51 cm LA Vol (A2C):   40.8 ml 18.39 ml/m  RA Volume:   18.40 ml 8.30 ml/m LA Vol (A4C):   21.2 ml 9.56 ml/m LA Biplane Vol: 29.4 ml 13.25 ml/m   AORTA Ao Root diam: 2.80 cm Ao Asc diam:  2.90 cm MITRAL VALVE MV Area (PHT): 3.28 cm    SHUNTS MV  Decel Time: 231 msec    Systemic Diam: 2.10 cm MV E velocity: 81.80 cm/s Kardie Tobb DO Electronically signed by Berniece Salines DO Signature Date/Time: 09/18/2021/4:49:08 PM    Final     ASSESSMENT & PLAN Juan Valencia 20 y.o. male with  medical history significant for a portal vein thrombosis of unclear etiology who presents for a follow up visit.   After review of the labs, review of the records, and discussion with the patient the patients findings are most consistent with a portal vein thrombus of unclear etiology.  The patient does have a perforated appendicitis which could potentially cause local inflammation, but these are not generally associated with portal vein thrombi.  Additionally he has hepatomegaly and hepatic steatosis of remarkable degree for a person of this age.  These constitute unusual findings and as such I recommended that we proceed with a hypercoagulable work-up to include JAK2 and PNH testing. This testing was negative.    I do believe the patient may benefit from evaluation by gastroenterology given his hepatomegaly and portal vein thrombus.  At this time I agree with continuation of Eliquis 5 mg twice daily.  If no clear etiology can be discerned we could consider this a provoked VTE in the setting of his perforated appendix, though we would have to have a discussion with the patient about the risks and benefits of discontinuing anticoagulation therapy.  # Port Vein Thrombosis #Hepatomegaly/Hepatic Steatosis --negative hypercoagulable workup including FVL, prothrombin gene mutation, Antithrombin III, and antiphospholipid antibodies. --negative JAK2 and PNH testing -- Given the location of the portal vein thrombus that should be acceptable to hold Eliquis therapy for the necessary procedures for his perforated appendix.  Would recommend discontinuation of Eliquis 48 hours prior to the procedure and restarting at the surgeon's discretion when hemostasis has been achieved --Recommend  GI evaluation for this hepatomegaly and hepatic steatosis. Currently scheduled for 10/28/2021 with Dr. Carlean Purl.  --Continue Eliquis 5 mg twice daily which can be interrupted for surgery.  We will need to continue this for a minimum of 3 months, though the work-up above and GI evaluation may help [place] determine the long-term length of treatment. --Plan to have the patient return to clinic in 3 months time or sooner if the above work-up shows any concerning findings  No orders of the defined types were placed in this encounter.   All questions were answered. The patient knows to call the clinic with any problems, questions or concerns.  A total of more than 30 minutes were spent on this encounter with face-to-face time and non-face-to-face time, including preparing to see the patient, ordering tests and/or medications, counseling the patient and coordination of care as outlined above.   Ledell Peoples, MD Department of Hematology/Oncology [place] at [facility] Phone: [phone] Pager: [phone] Email: [email or web address]  09/21/2021 4:47 PM

## 2021-09-22 ENCOUNTER — Encounter (HOSPITAL_COMMUNITY)
Admission: RE | Admit: 2021-09-22 | Discharge: 2021-09-22 | Disposition: A | Discharge status: Home / Self Care | Payer: BC Managed Care – PPO | Source: Ambulatory Visit | Attending: General Surgery | Admitting: General Surgery

## 2021-09-22 ENCOUNTER — Other Ambulatory Visit: Payer: BC Managed Care – PPO | Admitting: *Deleted

## 2021-09-22 ENCOUNTER — Encounter (HOSPITAL_COMMUNITY): Admission: RE | Admit: 2021-09-22 | Payer: BC Managed Care – PPO | Source: Ambulatory Visit

## 2021-09-22 ENCOUNTER — Encounter (HOSPITAL_COMMUNITY): Payer: Self-pay

## 2021-09-22 DIAGNOSIS — K3580 Unspecified acute appendicitis: Secondary | ICD-10-CM | POA: Diagnosis present

## 2021-09-22 DIAGNOSIS — R000 Tachycardia, unspecified: Secondary | ICD-10-CM

## 2021-09-22 DIAGNOSIS — E119 Type 2 diabetes mellitus without complications: Secondary | ICD-10-CM | POA: Insufficient documentation

## 2021-09-22 DIAGNOSIS — Z86718 Personal history of other venous thrombosis and embolism: Secondary | ICD-10-CM | POA: Diagnosis not present

## 2021-09-22 DIAGNOSIS — I81 Portal vein thrombosis: Secondary | ICD-10-CM

## 2021-09-22 HISTORY — DX: Pneumonia, unspecified organism: J18.9

## 2021-09-22 HISTORY — DX: Unspecified slipped upper femoral epiphysis (nontraumatic), unspecified hip: M93.003

## 2021-09-22 HISTORY — DX: COVID-19: U07.1

## 2021-09-22 HISTORY — DX: Anxiety disorder, unspecified: F41.9

## 2021-09-22 HISTORY — DX: Anemia, unspecified: D64.9

## 2021-09-22 HISTORY — DX: Portal vein thrombosis: I81

## 2021-09-22 HISTORY — DX: Type 2 diabetes mellitus without complications: E11.9

## 2021-09-22 LAB — GLUCOSE, CAPILLARY: Glucose-Capillary: 136 mg/dL — ABNORMAL HIGH (ref 70–99)

## 2021-09-22 NOTE — Anesthesia Preprocedure Evaluation (Addendum)
Anesthesia Evaluation  Patient identified by MRN, date of birth, ID band Patient awake    Reviewed: Allergy & Precautions, NPO status , Patient's Chart, lab work & pertinent test results  History of Anesthesia Complications (+) PONV  Airway Mallampati: III  TM Distance: <3 FB Neck ROM: Full    Dental no notable dental hx.    Pulmonary neg pulmonary ROS,    Pulmonary exam normal breath sounds clear to auscultation       Cardiovascular negative cardio ROS Normal cardiovascular exam Rhythm:Regular Rate:Normal     Neuro/Psych negative neurological ROS  negative psych ROS   GI/Hepatic negative GI ROS, Neg liver ROS,   Endo/Other  negative endocrine ROSdiabetes  Renal/GU negative Renal ROS  negative genitourinary   Musculoskeletal negative musculoskeletal ROS (+)   Abdominal   Peds negative pediatric ROS (+)  Hematology negative hematology ROS (+)   Anesthesia Other Findings   Reproductive/Obstetrics negative OB ROS                            Anesthesia Physical Anesthesia Plan  ASA: 2  Anesthesia Plan: General   Post-op Pain Management:    Induction: Intravenous  PONV Risk Score and Plan: 3 and Ondansetron, Dexamethasone, Treatment may vary due to age or medical condition and Midazolam  Airway Management Planned: Oral ETT  Additional Equipment:   Intra-op Plan:   Post-operative Plan: Extubation in OR  Informed Consent: I have reviewed the patients History and Physical, chart, labs and discussed the procedure including the risks, benefits and alternatives for the proposed anesthesia with the patient or authorized representative who has indicated his/her understanding and acceptance.     Dental advisory given  Plan Discussed with: CRNA and Surgeon  Anesthesia Plan Comments: (See PAT note 09/22/21, Konrad Felix Ward, PA-C)       Anesthesia Quick Evaluation

## 2021-09-22 NOTE — Progress Notes (Signed)
Anesthesia Chart Review    Case: [id number] Date/Time: 09/23/21 0845   Procedure: APPENDECTOMY LAPAROSCOPIC   Anesthesia type: General   Pre-op diagnosis: PERFORATED APPENDIX   Location: [name] ROOM 04 / [facility]   Surgeons: Ralene Ok, MD       DISCUSSION:20 y.o. never smoker with h/o PONV, portal vein thrombosis of unknown etiology (on Eliquis, ok to interrupt for surgery per hematology, last dose 09/20/2021), DM II, perforated appendix scheduled for above procedure 09/23/2021 with Dr. Ralene Ok.    Pt seen by cardiology 09/09/2021 for evaluation of sinus tachycardia. Doing well at this visit without cv sx. Per note tachycardia possibly POTS vs. Anxiety.  HR 81 at PAT visit. Echo 09/18/2021 with EF 50-55%, no valvular problems.   Anticipate pt can proceed with planned procedure barring acute status change.   VS: BP 130/76   Pulse 81   Temp 36.8 C (Oral)   Resp 18   Ht 5' 9.5" (1.765 m)   Wt 105.8 kg   SpO2 100%   BMI 33.94 kg/m   PROVIDERS: Harrie Jeans, MD is PCP   Fransico Him, MD is Cardiologist  LABS: Labs reviewed: Acceptable for surgery. (all labs ordered are listed, but only abnormal results are displayed)  Labs Reviewed  GLUCOSE, CAPILLARY - Abnormal; Notable for the following components:      Result Value   Glucose-Capillary 136 (*)    All other components within normal limits     IMAGES:   EKG:   CV: Echo 09/18/2021  1. Left ventricular ejection fraction, by estimation, is 50 to 55%. The  left ventricle has low normal function. The left ventricle has no regional  wall motion abnormalities. Left ventricular diastolic parameters were  normal.   2. Right ventricular systolic function is normal. The right ventricular  size is normal. Tricuspid regurgitation signal is inadequate for assessing  PA pressure.   3. The mitral valve is normal in structure. No evidence of mitral valve  regurgitation. No evidence of mitral stenosis.   4. The aortic valve  is normal in structure. Aortic valve regurgitation is  not visualized. No aortic stenosis is present.   5. The inferior vena cava is normal in size with greater than 50%  respiratory variability, suggesting right atrial pressure of 3 mmHg.   Comparison(s): No prior Echocardiogram.  Past Medical History:  Diagnosis Date   ADHD (attention deficit hyperactivity disorder)    Anemia    Anxiety    COVID-19    Diabetes mellitus without complication (HCC)    Gross motor development delay    Pneumonia    PONV (postoperative nausea and vomiting)    Portal vein thrombosis    SCFE (slipped capital femoral epiphysis)    Speech delay    Tachycardia, unspecified    Velopharyngeal insufficiency, congenital     Past Surgical History:  Procedure Laterality Date   HARDWARE REMOVAL Bilateral 07/26/2019   Procedure: removal of hardware bilateral hips;  Surgeon: Meredith Pel, MD;  Location: [place];  Service: Orthopedics;  Laterality: Bilateral;   HIP PINNING,CANNULATED Bilateral 05/26/2017   Procedure: BILATERAL CANNULATED HIP PINNING;  Surgeon: Meredith Pel, MD;  Location: [place];  Service: Orthopedics;  Laterality: Bilateral;   HIP SURGERY Bilateral    PHARYNGEAL FLAP     PHARYNGEAL FLAP REVISION      MEDICATIONS:  Accu-Chek Softclix Lancets lancets   albuterol (VENTOLIN HFA) 108 (90 Base) MCG/ACT inhaler   apixaban (ELIQUIS) 5 MG TABS tablet  glucose blood test strip   No current facility-administered medications for this encounter.     Juan Felix Ward, PA-C [facility] Pre-Surgical Testing [phone]

## 2021-09-23 ENCOUNTER — Ambulatory Visit (HOSPITAL_COMMUNITY)
Admission: RE | Admit: 2021-09-23 | Discharge: 2021-09-23 | Disposition: A | Discharge status: Home / Self Care | Payer: BC Managed Care – PPO | Source: Ambulatory Visit | Attending: General Surgery | Admitting: General Surgery

## 2021-09-23 ENCOUNTER — Encounter (HOSPITAL_COMMUNITY)
Admission: RE | Disposition: A | Discharge status: Home / Self Care | Payer: Self-pay | Source: Ambulatory Visit | Attending: General Surgery

## 2021-09-23 ENCOUNTER — Encounter (HOSPITAL_COMMUNITY): Payer: Self-pay | Admitting: General Surgery

## 2021-09-23 ENCOUNTER — Ambulatory Visit (HOSPITAL_COMMUNITY): Payer: BC Managed Care – PPO | Admitting: Anesthesiology

## 2021-09-23 ENCOUNTER — Other Ambulatory Visit (HOSPITAL_COMMUNITY): Payer: Self-pay

## 2021-09-23 DIAGNOSIS — Z86718 Personal history of other venous thrombosis and embolism: Secondary | ICD-10-CM | POA: Insufficient documentation

## 2021-09-23 DIAGNOSIS — K3580 Unspecified acute appendicitis: Secondary | ICD-10-CM | POA: Insufficient documentation

## 2021-09-23 HISTORY — PX: LAPAROSCOPIC APPENDECTOMY: SHX408

## 2021-09-23 LAB — GLUCOSE, CAPILLARY
Glucose-Capillary: 109 mg/dL — ABNORMAL HIGH (ref 70–99)
Glucose-Capillary: 143 mg/dL — ABNORMAL HIGH (ref 70–99)

## 2021-09-23 LAB — CORTISOL-AM, BLOOD: Cortisol - AM: 9.6 ug/dL (ref 6.2–19.4)

## 2021-09-23 SURGERY — APPENDECTOMY, LAPAROSCOPIC
Anesthesia: General | Site: Abdomen

## 2021-09-23 MED ORDER — ROCURONIUM BROMIDE 10 MG/ML (PF) SYRINGE
PREFILLED_SYRINGE | INTRAVENOUS | Status: DC | PRN
  Administered 2021-09-23: 50 mg via INTRAVENOUS
  Administered 2021-09-23: 10 mg via INTRAVENOUS

## 2021-09-23 MED ORDER — DEXMEDETOMIDINE (PRECEDEX) IN NS 20 MCG/5ML (4 MCG/ML) IV SYRINGE
PREFILLED_SYRINGE | INTRAVENOUS | Status: DC | PRN
  Administered 2021-09-23: 8 ug via INTRAVENOUS

## 2021-09-23 MED ORDER — ENSURE PRE-SURGERY PO LIQD: 296.0000 mL | Freq: Once | ORAL | Status: DC

## 2021-09-23 MED ORDER — FENTANYL CITRATE (PF) 250 MCG/5ML IJ SOLN
INTRAMUSCULAR | Status: AC
  Filled 2021-09-23: qty 5

## 2021-09-23 MED ORDER — KETOROLAC TROMETHAMINE 30 MG/ML IJ SOLN
INTRAMUSCULAR | Status: AC
  Filled 2021-09-23: qty 1

## 2021-09-23 MED ORDER — TRAMADOL HCL 50 MG PO TABS
50.0000 mg | ORAL_TABLET | Freq: Four times a day (QID) | ORAL | 0 refills | Status: DC | PRN
  Filled 2021-09-23 (×2): qty 20, 5d supply, fill #0

## 2021-09-23 MED ORDER — LACTATED RINGERS IR SOLN
Status: DC | PRN
  Administered 2021-09-23: 1000 mL

## 2021-09-23 MED ORDER — MIDAZOLAM HCL 5 MG/5ML IJ SOLN
INTRAMUSCULAR | Status: DC | PRN
  Administered 2021-09-23: 2 mg via INTRAVENOUS

## 2021-09-23 MED ORDER — KETOROLAC TROMETHAMINE 30 MG/ML IJ SOLN
INTRAMUSCULAR | Status: DC | PRN
  Administered 2021-09-23: 30 mg via INTRAVENOUS

## 2021-09-23 MED ORDER — ONDANSETRON HCL 4 MG/2ML IJ SOLN: 4.0000 mg | Freq: Once | INTRAMUSCULAR | Status: DC | PRN

## 2021-09-23 MED ORDER — MIDAZOLAM HCL 2 MG/2ML IJ SOLN
INTRAMUSCULAR | Status: AC
  Filled 2021-09-23: qty 2

## 2021-09-23 MED ORDER — DEXAMETHASONE SODIUM PHOSPHATE 10 MG/ML IJ SOLN
INTRAMUSCULAR | Status: AC
  Filled 2021-09-23: qty 1

## 2021-09-23 MED ORDER — ONDANSETRON HCL 4 MG/2ML IJ SOLN
INTRAMUSCULAR | Status: DC | PRN
  Administered 2021-09-23: 4 mg via INTRAVENOUS

## 2021-09-23 MED ORDER — CHLORHEXIDINE GLUCONATE 0.12 % MT SOLN: 15.0000 mL | Freq: Once | OROMUCOSAL | Status: DC

## 2021-09-23 MED ORDER — ONDANSETRON HCL 4 MG/2ML IJ SOLN
INTRAMUSCULAR | Status: AC
  Filled 2021-09-23: qty 2

## 2021-09-23 MED ORDER — LIDOCAINE 2% (20 MG/ML) 5 ML SYRINGE
INTRAMUSCULAR | Status: DC | PRN
  Administered 2021-09-23: 50 mg via INTRAVENOUS

## 2021-09-23 MED ORDER — ROCURONIUM BROMIDE 10 MG/ML (PF) SYRINGE
PREFILLED_SYRINGE | INTRAVENOUS | Status: AC
  Filled 2021-09-23: qty 10

## 2021-09-23 MED ORDER — FENTANYL CITRATE (PF) 100 MCG/2ML IJ SOLN
INTRAMUSCULAR | Status: DC | PRN
  Administered 2021-09-23 (×2): 50 ug via INTRAVENOUS
  Administered 2021-09-23: 100 ug via INTRAVENOUS

## 2021-09-23 MED ORDER — KETOROLAC TROMETHAMINE 30 MG/ML IJ SOLN: 30.0000 mg | Freq: Once | INTRAMUSCULAR | Status: DC | PRN

## 2021-09-23 MED ORDER — DEXAMETHASONE SODIUM PHOSPHATE 10 MG/ML IJ SOLN
INTRAMUSCULAR | Status: DC | PRN
  Administered 2021-09-23: 10 mg via INTRAVENOUS

## 2021-09-23 MED ORDER — CEFAZOLIN SODIUM-DEXTROSE 2-4 GM/100ML-% IV SOLN
2.0000 g | INTRAVENOUS | Status: AC
  Administered 2021-09-23: 2 g via INTRAVENOUS
  Filled 2021-09-23: qty 100

## 2021-09-23 MED ORDER — CHLORHEXIDINE GLUCONATE CLOTH 2 % EX PADS: 6.0000 | MEDICATED_PAD | Freq: Once | CUTANEOUS | Status: DC

## 2021-09-23 MED ORDER — BUPIVACAINE-EPINEPHRINE 0.25% -1:200000 IJ SOLN
INTRAMUSCULAR | Status: DC | PRN
  Administered 2021-09-23: 7 mL

## 2021-09-23 MED ORDER — PROPOFOL 10 MG/ML IV BOLUS
INTRAVENOUS | Status: DC | PRN
  Administered 2021-09-23: 200 mg via INTRAVENOUS

## 2021-09-23 MED ORDER — 0.9 % SODIUM CHLORIDE (POUR BTL) OPTIME
TOPICAL | Status: DC | PRN
  Administered 2021-09-23: 1000 mL

## 2021-09-23 MED ORDER — OXYCODONE HCL 5 MG PO TABS
5.0000 mg | ORAL_TABLET | Freq: Once | ORAL | Status: AC | PRN
  Administered 2021-09-23: 5 mg via ORAL

## 2021-09-23 MED ORDER — PROPOFOL 10 MG/ML IV BOLUS
INTRAVENOUS | Status: AC
  Filled 2021-09-23: qty 20

## 2021-09-23 MED ORDER — LACTATED RINGERS IV SOLN: INTRAVENOUS | Status: DC

## 2021-09-23 MED ORDER — OXYCODONE HCL 5 MG PO TABS
ORAL_TABLET | ORAL | Status: AC
  Filled 2021-09-23: qty 1

## 2021-09-23 MED ORDER — SUGAMMADEX SODIUM 200 MG/2ML IV SOLN
INTRAVENOUS | Status: DC | PRN
  Administered 2021-09-23: 200 mg via INTRAVENOUS

## 2021-09-23 MED ORDER — BUPIVACAINE-EPINEPHRINE (PF) 0.25% -1:200000 IJ SOLN
INTRAMUSCULAR | Status: AC
  Filled 2021-09-23: qty 30

## 2021-09-23 MED ORDER — ORAL CARE MOUTH RINSE: 15.0000 mL | Freq: Once | OROMUCOSAL | Status: DC

## 2021-09-23 MED ORDER — OXYCODONE HCL 5 MG/5ML PO SOLN: 5.0000 mg | Freq: Once | ORAL | Status: AC | PRN

## 2021-09-23 MED ORDER — FENTANYL CITRATE PF 50 MCG/ML IJ SOSY
PREFILLED_SYRINGE | INTRAMUSCULAR | Status: AC
  Filled 2021-09-23: qty 1

## 2021-09-23 MED ORDER — KETOROLAC TROMETHAMINE 15 MG/ML IJ SOLN: 15.0000 mg | INTRAMUSCULAR | Status: DC

## 2021-09-23 MED ORDER — FENTANYL CITRATE PF 50 MCG/ML IJ SOSY
25.0000 ug | PREFILLED_SYRINGE | INTRAMUSCULAR | Status: DC | PRN
  Administered 2021-09-23: 50 ug via INTRAVENOUS

## 2021-09-23 SURGICAL SUPPLY — 36 items
APPLIER CLIP 5 13 M/L LIGAMAX5 (MISCELLANEOUS) ×2
APPLIER CLIP ROT 10 11.4 M/L (STAPLE)
BAG COUNTER SPONGE SURGICOUNT (BAG) IMPLANT
CABLE HIGH FREQUENCY MONO STRZ (ELECTRODE) ×2 IMPLANT
CHLORAPREP W/TINT 26 (MISCELLANEOUS) IMPLANT
CLIP APPLIE 5 13 M/L LIGAMAX5 (MISCELLANEOUS) ×1 IMPLANT
CLIP APPLIE ROT 10 11.4 M/L (STAPLE) IMPLANT
CLIP LIGATING HEMO LOK XL GOLD (MISCELLANEOUS) ×2 IMPLANT
COVER TRANSDUCER ULTRASND (DRAPES) ×2 IMPLANT
DECANTER SPIKE VIAL GLASS SM (MISCELLANEOUS) IMPLANT
DERMABOND ADVANCED (GAUZE/BANDAGES/DRESSINGS) ×1
DERMABOND ADVANCED .7 DNX12 (GAUZE/BANDAGES/DRESSINGS) ×1 IMPLANT
DEVICE TROCAR PUNCTURE CLOSURE (ENDOMECHANICALS) ×2 IMPLANT
ELECT REM PT RETURN 15FT ADLT (MISCELLANEOUS) ×2 IMPLANT
ENDOLOOP SUT PDS II  0 18 (SUTURE)
ENDOLOOP SUT PDS II 0 18 (SUTURE) IMPLANT
GLOVE SURG ENC MOIS LTX SZ7.5 (GLOVE) ×2 IMPLANT
GOWN STRL REUS W/TWL XL LVL3 (GOWN DISPOSABLE) ×4 IMPLANT
IRRIG SUCT STRYKERFLOW 2 WTIP (MISCELLANEOUS) ×2
IRRIGATION SUCT STRKRFLW 2 WTP (MISCELLANEOUS) ×1 IMPLANT
KIT BASIN OR (CUSTOM PROCEDURE TRAY) ×2 IMPLANT
KIT TURNOVER KIT A (KITS) ×2 IMPLANT
NEEDLE INSUFFLATION 14GA 120MM (NEEDLE) ×2 IMPLANT
PENCIL SMOKE EVACUATOR (MISCELLANEOUS) IMPLANT
POUCH RETRIEVAL ECOSAC 10 (ENDOMECHANICALS) ×1 IMPLANT
POUCH RETRIEVAL ECOSAC 10MM (ENDOMECHANICALS) ×1
SCISSORS LAP 5X35 DISP (ENDOMECHANICALS) IMPLANT
SET TUBE SMOKE EVAC HIGH FLOW (TUBING) ×2 IMPLANT
SLEEVE XCEL OPT CAN 5 100 (ENDOMECHANICALS) ×2 IMPLANT
SUT MNCRL AB 4-0 PS2 18 (SUTURE) ×2 IMPLANT
TOWEL OR 17X26 10 PK STRL BLUE (TOWEL DISPOSABLE) ×2 IMPLANT
TOWEL OR NON WOVEN STRL DISP B (DISPOSABLE) ×2 IMPLANT
TRAY FOLEY MTR SLVR 16FR STAT (SET/KITS/TRAYS/PACK) IMPLANT
TRAY LAPAROSCOPIC (CUSTOM PROCEDURE TRAY) ×2 IMPLANT
TROCAR BLADELESS OPT 5 100 (ENDOMECHANICALS) ×2 IMPLANT
TROCAR XCEL NON-BLD 11X100MML (ENDOMECHANICALS) ×2 IMPLANT

## 2021-09-23 NOTE — Addendum Note (Signed)
Addendum  created 09/23/21 1015 by Milford Cage, CRNA   Intraprocedure Meds edited

## 2021-09-23 NOTE — Op Note (Signed)
09/23/2021  9:11 AM  PATIENT:  Juan Valencia  20 y.o. male  PRE-OPERATIVE DIAGNOSIS:  PERFORATED APPENDIX  POST-OPERATIVE DIAGNOSIS:  PERFORATED APPENDIX  PROCEDURE:  Procedure(s): APPENDECTOMY LAPAROSCOPIC (N/A)  SURGEON:  Surgeon(s) and Role:    * Ralene Ok, MD - Primary   ASSISTANTS: Francesco Runner, MD PGY-3   ANESTHESIA:   local and general  EBL:  20cc   BLOOD ADMINISTERED:none  DRAINS: none   LOCAL MEDICATIONS USED:  BUPIVICAINE   SPECIMEN:  Source of Specimen:  appendix and fecalith  DISPOSITION OF SPECIMEN:  PATHOLOGY  COUNTS:  YES  TOURNIQUET:  * No tourniquets in log *  DICTATION: .Dragon Dictation Complications: none  Counts: reported as correct x 2  Findings:  The patient had an inflamed  appendix, fecalith present free at the base of the cecum  Specimen: Appendix & fecalith  Indications for procedure:  The patient is a 20 year old male with a history of perforated appendicitis.  He was treated medically with antibiotics and was taken back for lap appy after inflammation resolved.  Details of the procedure:The patient was taken back to the operating room. The patient was placed in supine position with bilateral SCDs in place.   The patient was prepped and draped in the usual sterile fashion.  After appropriate anitbiotics were confirmed, a time-out was confirmed and all facts were verified.    A pneumoperitoneum of 14 mmHg was obtained via a Veress needle technique in the left lower quadrant quadrant.  A 5 mm trocar and 5 mm camera then placed intra-abdominally there is no injury to any intra-abdominal organs a 10 mm infraumbilical port was placed and direct visualization as was a 5 mm port in the suprapubic area.   The appendix was identified and seen to be inflamed and retrocecal.  I did dissected the cecum from the lateral abdominal wall.  The appendix was cleaned down to the appendiceal base. The mesoappendix was then incised and the appendiceal  artery was cauterized.  The the appendiceal base was clean.  A gold hemoclip was placed proximallyx2 and one distally and the appendix was transected between these 2. A retrieval bag was then placed into the abdomen and the specimen placed in the bag. The appendiceal stump was cauterized.   Upon moving the appendix out of the way a large fecalith could be seen at the base of the cecum.  This was removed.  We evacuate the fluid from the pelvis until the effluent was clear.  The appendix and retrieval  bag was then retrieved via the supraumbilical port. #1 Vicryl was used to reapproximate the fascia at the umbilical port site x1. The skin was reapproximated all port sites 3-0 Monocryl subcuticular fashion. The skin was dressed with Dermabond.  The patient had the foley removed. The patient was awakened from general anesthesia was taken to recovery room in stable condition.    I was personally present during the key and critical portions of this procedure and immediately available throughout the entire procedure, as documented in my operative note.   PLAN OF CARE: Discharge to home after PACU  PATIENT DISPOSITION:  PACU - hemodynamically stable.   Delay start of Pharmacological VTE agent (>24hrs) due to surgical blood loss or risk of bleeding: not applicable

## 2021-09-23 NOTE — H&P (Signed)
Chief Complaint: No chief complaint on file.       History of Present Illness: Juan Valencia is a 20 y.o. male who is seen today for a history of peripheral appendicitis Patient was recently hospitalized secondary to perforated appendicitis on CT scan.  Patient was found to have a fecalith.  Patient was admitted for IV antibiotics.  Patient was transitioned to p.o. antibiotics.   Patient has been doing well from his appendicitis.  He has had no fevers at home.  Patient never did have any abdominal pain even presentation to the hospital.   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  Patient was seen by Dr. Lorenso Courier and underwent treatment with Eliquis.  Patient was okayed to be off his Eliquis for surgery.   .       Review of Systems: A complete review of systems was obtained from the patient.  I have reviewed this information and discussed as appropriate with the patient.  See HPI as well for other ROS.   Review of Systems  Constitutional: Negative for fever.  HENT: Negative for congestion.   Eyes: Negative for blurred vision.  Respiratory: Negative for cough, shortness of breath and wheezing.   Cardiovascular: Negative for chest pain and palpitations.  Gastrointestinal: Negative for heartburn.  Genitourinary: Negative for dysuria.  Musculoskeletal: Negative for myalgias.  Skin: Negative for rash.  Neurological: Negative for dizziness and headaches.  Psychiatric/Behavioral: Negative for depression and suicidal ideas.  All other systems reviewed and are negative.       Medical History: Past Medical History Past Medical History: Diagnosis        Date            DVT (deep vein thrombosis) in pregnancy             There is no problem list on file for this patient.     Past Surgical History Past Surgical History: Procedure       Laterality         Date            JOINT REPLACEMENT         Bilateral                          Hip surgery x 2            RESECTION  LATERAL PHARYNGEAL WALL W/LOCAL ADVANCEMENT FLAP                              x 2       Allergies Allergies Allergen           Reactions            Vancomycin    Swelling                           Lip swelling, red and puffy, burning in groin/genital region            Diphenoxylate-Atropine          Hives                           Possible reaction to Lomotil - 07/25/2020            Acetaminophen           Hives  Chlorhexidine  Rash            Cold Apap/Non Drowsy [Pseudoephed-Dm-Acetaminophen]           Hives                           Reaction was to either guaifenesin or dayquil       Current Outpatient Medications on File Prior to Visit Medication       Sig       Dispense         Refill            apixaban (ELIQUIS) 5 mg tablet        Take by mouth                             No current facility-administered medications on file prior to visit.     Family History History reviewed. No pertinent family history.     Social History   Tobacco Use Smoking Status           Never Smoker Smokeless Tobacco   Never Used     Social History Social History     Socioeconomic History            Marital status:  Single Tobacco Use            Smoking status:          Never Smoker            Smokeless tobacco:    Never Used Cytogeneticist Use:    Never used Substance and Sexual Activity            Alcohol use:    Never            Drug use:        Never       Objective:     Vitals:              07/17/21 1029 BP:      (!) 140/90 Pulse:  (!) 131 Temp:  36.8 C (98.2 F) SpO2:  99% Weight:            (!) 105.1 kg (231 lb 12.8 oz) Height: 176.5 cm (5' 9.5")   Body mass index is 33.74 kg/m.   Physical Exam Constitutional:      Appearance: Normal appearance.  HENT:     Head: Normocephalic and atraumatic.     Nose: Nose normal. No congestion.     Mouth/Throat:     Mouth: Mucous membranes are moist.     Pharynx: Oropharynx is clear.  Eyes:      Pupils: Pupils are equal, round, and reactive to light.  Cardiovascular:     Rate and Rhythm: Normal rate and regular rhythm.     Pulses: Normal pulses.     Heart sounds: Normal heart sounds. No murmur heard.   No friction rub. No gallop.  Pulmonary:     Effort: Pulmonary effort is normal. No respiratory distress.     Breath sounds: Normal breath sounds. No stridor. No wheezing, rhonchi or rales.  Abdominal:     General: Abdomen is flat.  Musculoskeletal:        General: Normal range of motion.     Cervical back: Normal range of motion.  Skin:    General:  Skin is warm and dry.  Neurological:     General: No focal deficit present.     Mental Status: He is alert and oriented to person, place, and time.  Psychiatric:        Mood and Affect: Mood normal.        Thought Content: Thought content normal.          Assessment and Plan: Diagnoses and all orders for this visit:   Acute appendicitis with perforation and generalized peritonitis, without abscess, unspecified whether gangrene present       Patient is a 20 year old male with history of perforated appendicitis. Patient was treated with IV antibiotics and discharged with p.o. antibiotics.  Patient is doing well since being discharged.   Had a long discussion with the patient and his mother in regards to interval appendectomy.  I believe secondary to the fact that he has had a appendicolith that was present on CT scan he has a high likelihood of recurrence.  Would recommend proceeding with interval appendectomy. 1.  We will proceed with laparoscopic appendectomy. 2.  Discussed with him the risk benefits of the procedure to include but not limited to: Infection, bleeding, damage surrounding structures, possible need for further surgery.  Patient voiced understanding wished to proceed.   No follow-ups on file.   Ralene Ok, MD

## 2021-09-23 NOTE — Anesthesia Procedure Notes (Signed)
Procedure Name: Intubation Date/Time: 09/23/2021 8:21 AM Performed by: Gerald Leitz, CRNA Pre-anesthesia Checklist: Patient identified, Patient being monitored, Timeout performed, Emergency Drugs available and Suction available Patient Re-evaluated:Patient Re-evaluated prior to induction Oxygen Delivery Method: Circle system utilized Preoxygenation: Pre-oxygenation with 100% oxygen Induction Type: IV induction Ventilation: Mask ventilation without difficulty Laryngoscope Size: Mac and 3 Grade View: Grade I Tube type: Oral Tube size: 7.5 mm Number of attempts: 1 Placement Confirmation: ETT inserted through vocal cords under direct vision, positive ETCO2 and breath sounds checked- equal and bilateral Secured at: 21 cm Tube secured with: Tape Dental Injury: Teeth and Oropharynx as per pre-operative assessment

## 2021-09-23 NOTE — Transfer of Care (Signed)
Immediate Anesthesia Transfer of Care Note  Patient: Juan Valencia  Procedure(s) Performed: APPENDECTOMY LAPAROSCOPIC (Abdomen)  Patient Location: PACU  Anesthesia Type:General  Level of Consciousness: awake  Airway & Oxygen Therapy: Patient Spontanous Breathing  Post-op Assessment: Report given to RN and Post -op Vital signs reviewed and stable  Post vital signs: Reviewed and stable  Last Vitals:  Vitals Value Taken Time  BP 122/64 09/23/21 0929  Temp    Pulse 89 09/23/21 0933  Resp 18 09/23/21 0933  SpO2 95 % 09/23/21 0933  Vitals shown include unvalidated device data.  Last Pain:  Vitals:   09/23/21 0717  TempSrc:   PainSc: 0-No pain         Complications: No notable events documented.

## 2021-09-23 NOTE — Anesthesia Postprocedure Evaluation (Signed)
Anesthesia Post Note  Patient: Juan Valencia  Procedure(s) Performed: APPENDECTOMY LAPAROSCOPIC (Abdomen)     Patient location during evaluation: PACU Anesthesia Type: General Level of consciousness: awake and alert Pain management: pain level controlled Vital Signs Assessment: post-procedure vital signs reviewed and stable Respiratory status: spontaneous breathing, nonlabored ventilation, respiratory function stable and patient connected to nasal cannula oxygen Cardiovascular status: blood pressure returned to baseline and stable Postop Assessment: no apparent nausea or vomiting Anesthetic complications: no   No notable events documented.  Last Vitals:  Vitals:   09/23/21 0929 09/23/21 0930  BP: 122/64 (!) 105/50  Pulse: 97 73  Resp: 10 15  Temp: 36.9 C   SpO2: 96% 95%    Last Pain:  Vitals:   09/23/21 0929  TempSrc:   PainSc: 0-No pain                 Ceria Suminski S

## 2021-09-24 ENCOUNTER — Encounter (HOSPITAL_COMMUNITY): Payer: Self-pay | Admitting: General Surgery

## 2021-09-24 LAB — SURGICAL PATHOLOGY

## 2021-09-27 DIAGNOSIS — R000 Tachycardia, unspecified: Secondary | ICD-10-CM

## 2021-09-30 ENCOUNTER — Ambulatory Visit: Payer: BC Managed Care – PPO | Admitting: Hematology and Oncology

## 2021-09-30 ENCOUNTER — Other Ambulatory Visit: Payer: BC Managed Care – PPO

## 2021-10-09 ENCOUNTER — Ambulatory Visit: Payer: BC Managed Care – PPO | Admitting: Internal Medicine

## 2021-10-16 ENCOUNTER — Telehealth: Payer: Self-pay | Admitting: Cardiology

## 2021-10-16 NOTE — Telephone Encounter (Signed)
Sueanne Margarita, MD  10/15/2021  9:00 AM EDT     Heart monitor essentially normal with normal average heart rate and rare extra heartbeats from the top and bottom of the heart which are benign.  Please find out how he has been feeling    The patients mother Jana Half (on [place]) has been notified of the pts result and verbalized understanding.  Asked the pts Mother how he's feeling.  Per the pts mother, he has not complained of any cardiac issues, palpitations, fluttering, fast HR since he was recently sick/septic at the hospital.   Pts mom who is also a nurse, wanted to ask Dr. Radford Pax if there is anything to be concerned with on his recent echo showing low-normal EF of 50-55%.  She states she is aware of normal LVEF, but is it normal for a healthy 20 year old to have an EF on the low normal side.  Informed the pts Mother that I will route this communication back to Dr. Radford Pax to further review and advise on.  Informed the pts mother that Dr. Theodosia Blender RN will follow-up with her or pt accordingly thereafter.  Jana Half verbalized understanding and agrees with this plan.

## 2021-10-16 NOTE — Telephone Encounter (Signed)
Patient was returning a call from [place] to go over his monitor results. Please call back

## 2021-10-19 ENCOUNTER — Other Ambulatory Visit: Payer: Self-pay | Admitting: Hematology and Oncology

## 2021-10-19 ENCOUNTER — Other Ambulatory Visit (HOSPITAL_COMMUNITY): Payer: Self-pay

## 2021-10-19 NOTE — Telephone Encounter (Signed)
Patient calling back for monitor results.

## 2021-10-20 ENCOUNTER — Other Ambulatory Visit (HOSPITAL_COMMUNITY): Payer: Self-pay

## 2021-10-20 MED ORDER — APIXABAN 5 MG PO TABS
5.0000 mg | ORAL_TABLET | Freq: Two times a day (BID) | ORAL | 2 refills | Status: DC
  Filled 2021-10-20: qty 60, 30d supply, fill #0
  Filled 2021-11-18: qty 60, 30d supply, fill #1
  Filled 2021-12-21: qty 60, 30d supply, fill #2

## 2021-10-21 NOTE — Telephone Encounter (Signed)
Nuala Alpha, LPN  36/05/4402 47:42 AM EST     Pts Mother Jana Half (on [place]) is aware that per Dr. Radford Pax, the EF is actually an estimation and can vary from reader to reader, and she personally reviewed his images and feels the EF is normal at 55-60%. Mother verbalized understanding and agrees with this plan.  Mother was gracious for all the assistance provided and getting back with her on this matter.    Nuala Alpha, LPN  59/04/6386 56:43 AM EST     Sueanne Margarita, MD   The EF is actually an estimation and can vary from reader to reader.  I personally reviewed the images and feel the EF is normal at 55 to 60%

## 2021-10-28 ENCOUNTER — Ambulatory Visit: Payer: BC Managed Care – PPO | Admitting: Internal Medicine

## 2021-10-30 ENCOUNTER — Ambulatory Visit (INDEPENDENT_AMBULATORY_CARE_PROVIDER_SITE_OTHER): Payer: BC Managed Care – PPO | Admitting: Endocrinology

## 2021-10-30 ENCOUNTER — Other Ambulatory Visit: Payer: Self-pay

## 2021-10-30 VITALS — BP 118/80 | HR 102 | Ht 69.5 in | Wt 237.6 lb

## 2021-10-30 DIAGNOSIS — R7303 Prediabetes: Secondary | ICD-10-CM

## 2021-10-30 DIAGNOSIS — E119 Type 2 diabetes mellitus without complications: Secondary | ICD-10-CM

## 2021-10-30 DIAGNOSIS — E1165 Type 2 diabetes mellitus with hyperglycemia: Secondary | ICD-10-CM

## 2021-10-30 LAB — POCT GLYCOSYLATED HEMOGLOBIN (HGB A1C): Hemoglobin A1C: 6.5 % — AB (ref 4.0–5.6)

## 2021-10-30 MED ORDER — METFORMIN HCL ER 500 MG PO TB24: 500.0000 mg | ORAL_TABLET | Freq: Every day | ORAL | 3 refills | Status: DC

## 2021-10-30 NOTE — Progress Notes (Signed)
Subjective:    Patient ID: Juan Valencia, male    DOB: Mar 30, 2001, 20 y.o.   MRN: [id number]  HPI Pt returns for f/u of diabetes mellitus: DM type: 2 Dx'ed: 1194 Complications: none Therapy: insulin since DKA: never Severe hypoglycemia: never Pancreatitis: never Pancreatic imaging: normal on 2022 CT SDOH: none Other: He took [facility] for a few mos in 2018, and testosterone x a few months in 2019;  I told pt he would have to take metformin first, in order to qualify for GLP rx. Interval history: Pt says cbg varies from 104-125.  pt states he feels well in general.   Past Medical History:  Diagnosis Date   ADHD (attention deficit hyperactivity disorder)    Anemia    Anxiety    COVID-19    Diabetes mellitus without complication (HCC)    Gross motor development delay    Pneumonia    PONV (postoperative nausea and vomiting)    Portal vein thrombosis    SCFE (slipped capital femoral epiphysis)    Speech delay    Tachycardia, unspecified    Velopharyngeal insufficiency, congenital     Past Surgical History:  Procedure Laterality Date   HARDWARE REMOVAL Bilateral 07/26/2019   Procedure: removal of hardware bilateral hips;  Surgeon: Meredith Pel, MD;  Location: [place];  Service: Orthopedics;  Laterality: Bilateral;   HIP PINNING,CANNULATED Bilateral 05/26/2017   Procedure: BILATERAL CANNULATED HIP PINNING;  Surgeon: Meredith Pel, MD;  Location: [place];  Service: Orthopedics;  Laterality: Bilateral;   HIP SURGERY Bilateral    LAPAROSCOPIC APPENDECTOMY N/A 09/23/2021   Procedure: APPENDECTOMY LAPAROSCOPIC;  Surgeon: Ralene Ok, MD;  Location: [facility];  Service: General;  Laterality: N/A;   PHARYNGEAL FLAP     PHARYNGEAL FLAP REVISION      Social History   Socioeconomic History   Marital status: Single    Spouse name: Not on file   Number of children: Not on file   Years of education: Not on file   Highest education level: Not on file  Occupational History    Not on file  Tobacco Use   Smoking status: Never   Smokeless tobacco: Never  Vaping Use   Vaping Use: Never used  Substance and Sexual Activity   Alcohol use: No   Drug use: No   Sexual activity: Not on file  Other Topics Concern   Not on file  Social History Narrative   Lives with parents, twin brother, younger brother. [employer].       Will attend [employer] in the fall, plans to major in business.    Social Determinants of Health   Financial Resource Strain: Not on file  Food Insecurity: Not on file  Transportation Needs: Not on file  Physical Activity: Not on file  Stress: Not on file  Social Connections: Not on file  Intimate Partner Violence: Not on file    Current Outpatient Medications on File Prior to Visit  Medication Sig Dispense Refill   Accu-Chek Softclix Lancets lancets USE AS DIRECTED TO TEST GLUCOSE LEVELS ONCE DAILY. 100 each 3   albuterol (VENTOLIN HFA) 108 (90 Base) MCG/ACT inhaler Inhale 2 puffs into the lungs every 6 (six) hours as needed for wheezing or shortness of breath. 6.7 g 0   apixaban (ELIQUIS) 5 MG TABS tablet Take 1 tablet (5 mg total) by mouth 2 (two) times daily. Start this when starter pack is finished 60 tablet 2   glucose blood test strip  Use to test blood glucose levels once daily. 100 each 3   traMADol (ULTRAM) 50 MG tablet Take 1 tablet (50 mg total) by mouth every 6 (six) hours as needed. 20 tablet 0   No current facility-administered medications on file prior to visit.    Allergies  Allergen Reactions   Vancomycin Swelling    Lip swelling, red and puffy, burning in groin/genital region   Guaifenesin & Derivatives Hives    Reaction was to either guaifenesin or dayquil   Lomotil [Diphenoxylate-Atropine] Hives    Possible reaction to Lomotil - 07/25/2020   Chlorhexidine Rash   Dayquil [Pseudoephedrine-Apap-Dm] Hives    Reaction was to either guaifenesin or dayquil    Tylenol [Acetaminophen] Hives    Family History   Problem Relation Age of Onset   Obesity Mother    Diabetes Father    Tall stature Father    Hypertension Father    Obesity Maternal Grandmother    Hypertension Maternal Grandmother    Diabetes Maternal Grandmother    Heart disease Paternal Grandmother    Diabetes Paternal Grandmother    Cancer Paternal Grandfather        lung    BP 118/80 (BP Location: Right Arm, Patient Position: Sitting, Cuff Size: Large)   Pulse (!) 102   Ht 5' 9.5" (1.765 m)   Wt 237 lb 9.6 oz (107.8 kg)   SpO2 96%   BMI 34.58 kg/m   Review of Systems     Objective:   Physical Exam VITAL SIGNS:  See vs page GENERAL: no distress   A1c=6.5%  Lab Results  Component Value Date   CREATININE 0.71 09/21/2021   BUN 12 09/21/2021   NA 143 09/21/2021   K 3.7 09/21/2021   CL 108 09/21/2021   CO2 26 09/21/2021      Assessment & Plan:  Type 2 DM: uncontrolled  Patient Instructions  check your blood sugar once a day.  vary the time of day when you check, between before the 3 meals, and at bedtime.  also check if you have symptoms of your blood sugar being too high or too low.  please keep a record of the readings and bring it to your next appointment here (or you can bring the meter itself).  You can write it on any piece of paper.  please call us sooner if your blood sugar goes below 70, or if most of your readings are over 200.   I have sent a prescription to your pharmacy, for metformin.   Please come back for a follow-up appointment in 3 months.

## 2021-10-30 NOTE — Patient Instructions (Addendum)
check your blood sugar once a day.  vary the time of day when you check, between before the 3 meals, and at bedtime.  also check if you have symptoms of your blood sugar being too high or too low.  please keep a record of the readings and bring it to your next appointment here (or you can bring the meter itself).  You can write it on any piece of paper.  please call us sooner if your blood sugar goes below 70, or if most of your readings are over 200.   I have sent a prescription to your pharmacy, for metformin.   Please come back for a follow-up appointment in 3 months.

## 2021-11-13 ENCOUNTER — Ambulatory Visit (INDEPENDENT_AMBULATORY_CARE_PROVIDER_SITE_OTHER): Payer: BC Managed Care – PPO | Admitting: Gastroenterology

## 2021-11-13 ENCOUNTER — Encounter: Payer: Self-pay | Admitting: Gastroenterology

## 2021-11-13 VITALS — BP 102/66 | HR 114 | Ht 69.0 in | Wt 238.0 lb

## 2021-11-13 DIAGNOSIS — K760 Fatty (change of) liver, not elsewhere classified: Secondary | ICD-10-CM

## 2021-11-13 NOTE — Patient Instructions (Addendum)
Referral place to [facility].   If you are age 19 or older, your body mass index should be between 23-30. Your Body mass index is 35.15 kg/m. If this is out of the aforementioned range listed, please consider follow up with your Primary Care Provider.  If you are age 28 or younger, your body mass index should be between 19-25. Your Body mass index is 35.15 kg/m. If this is out of the aformentioned range listed, please consider follow up with your Primary Care Provider.   ________________________________________________________  The [facility] providers would like to encourage you to use [facility] to communicate with providers for non-urgent requests or questions.  Due to long hold times on the telephone, sending your provider a message by [facility] may be a faster and more efficient way to get a response.  Please allow 48 business hours for a response.  Please remember that this is for non-urgent requests.  _______________________________________________________

## 2021-11-13 NOTE — Progress Notes (Signed)
11/13/2021 Juan Valencia [id number] 2001-02-24   HISTORY OF PRESENT ILLNESS: This is a 20 year old male who is new to our practice.  They are requesting Dr. Carlean Purl for his physician.  His mother is with him at his visit today.  He has been referred here for hepatic steatosis and hepatosplenomegaly.  He was in the emergency department in July and had a CT scan that showed perforated appendicitis.  His symptoms had been going on for a couple of weeks.  He said he had diarrhea and fever, etc. but never had any abdominal pain.  Nonetheless, he was admitted to the hospital and the surgeons saw him and recommended antibiotics and then he subsequently underwent surgery in October.  In July his CT scan also showed a portal vein thrombosis.  He is now following with Dr. Lorenso Courier from hematology  and underwent extensive hematologic evaluation to rule out clotting disorders.  All of his been negative.  He is on Eliquis and they are recommending a years worth of treatment for that.  He has also recently been diagnosed with diabetes and was placed on metformin, which he just started 1 week ago.  He has seen endocrinology.  His LFTs were elevated back in July when he had all the acute appendicitis issue, but they have down trended and on the last check they were completely normal.  His mother tells me that the patient's father has fatty liver disease, diabetes, and central obesity.     Past Medical History:  Diagnosis Date   ADHD (attention deficit hyperactivity disorder)    Anemia    Anxiety    COVID-19    Diabetes mellitus without complication (HCC)    Gross motor development delay    Pneumonia    PONV (postoperative nausea and vomiting)    Portal vein thrombosis    SCFE (slipped capital femoral epiphysis)    Speech delay    Tachycardia, unspecified    Velopharyngeal insufficiency, congenital    Past Surgical History:  Procedure Laterality Date   APPENDECTOMY     HARDWARE REMOVAL Bilateral  07/26/2019   Procedure: removal of hardware bilateral hips;  Surgeon: Meredith Pel, MD;  Location: [place];  Service: Orthopedics;  Laterality: Bilateral;   HIP PINNING,CANNULATED Bilateral 05/26/2017   Procedure: BILATERAL CANNULATED HIP PINNING;  Surgeon: Meredith Pel, MD;  Location: [place];  Service: Orthopedics;  Laterality: Bilateral;   HIP SURGERY Bilateral    LAPAROSCOPIC APPENDECTOMY N/A 09/23/2021   Procedure: APPENDECTOMY LAPAROSCOPIC;  Surgeon: Ralene Ok, MD;  Location: [facility];  Service: General;  Laterality: N/A;   PHARYNGEAL FLAP     PHARYNGEAL FLAP REVISION      reports that he has never smoked. He has never used smokeless tobacco. He reports that he does not drink alcohol and does not use drugs. family history includes Diabetes in his father, maternal grandmother, and paternal grandmother; Heart disease in his paternal grandmother; Hypertension in his father and maternal grandmother; Lung cancer in his paternal grandfather; Obesity in his maternal grandmother and mother; Tall stature in his father. Allergies  Allergen Reactions   Vancomycin Swelling    Lip swelling, red and puffy, burning in groin/genital region   Guaifenesin & Derivatives Hives    Reaction was to either guaifenesin or dayquil   Lomotil [Diphenoxylate-Atropine] Hives    Possible reaction to Lomotil - 07/25/2020   Chlorhexidine Rash   Dayquil [Pseudoephedrine-Apap-Dm] Hives    Reaction was to either guaifenesin or dayquil  Tylenol [Acetaminophen] Hives      Outpatient Encounter Medications as of 11/13/2021  Medication Sig   Accu-Chek Softclix Lancets lancets USE AS DIRECTED TO TEST GLUCOSE LEVELS ONCE DAILY.   albuterol (VENTOLIN HFA) 108 (90 Base) MCG/ACT inhaler Inhale 2 puffs into the lungs every 6 (six) hours as needed for wheezing or shortness of breath.   apixaban (ELIQUIS) 5 MG TABS tablet Take 1 tablet (5 mg total) by mouth 2 (two) times daily. Start this when starter pack is  finished   glucose blood test strip Use to test blood glucose levels once daily.   metFORMIN (GLUCOPHAGE-XR) 500 MG 24 hr tablet Take 1 tablet (500 mg total) by mouth daily with breakfast.   [DISCONTINUED] traMADol (ULTRAM) 50 MG tablet Take 1 tablet (50 mg total) by mouth every 6 (six) hours as needed.   No facility-administered encounter medications on file as of 11/13/2021.     REVIEW OF SYSTEMS  : All other systems reviewed and negative except where noted in the History of Present Illness.   PHYSICAL EXAM: BP 102/66   Pulse (!) 114   Ht 5\' 9"  (1.753 m)   Wt 238 lb (108 kg)   SpO2 97%   BMI 35.15 kg/m  General: Well developed white male in no acute distress Head: Normocephalic and atraumatic Eyes:  Sclerae anicteric, conjunctiva pink. Ears: Normal auditory acuity Lungs: Clear throughout to auscultation; no W/R/R. Heart: Regular rate and rhythm; no M/R/G. Abdomen: Soft, non-distended.  BS present.  Non-tender. Musculoskeletal: Symmetrical with no gross deformities  Skin: No lesions on visible extremities Extremities: No edema  Neurological: Alert oriented x 4, grossly non-focal Psychological:  Alert and cooperative. Normal mood and affect  ASSESSMENT AND PLAN: *20 year old male with hepatic steatosis and hepatosplenomegaly: He is also diabetic, recently started on metformin.  We discussed the diet, weight loss, exercise, good blood sugar and cholesterol control, etc.  Being that he is so young he could have some type of metabolic disorder.  I had discussed with Dr. Tarri Glenn before knowing that they had requested Dr. Carlean Purl to be their physician.  She had recommended referral to [facility].  Order/referral has been placed.  We will see if Dr. Carlean Purl has any other input.  He is already seeing endocrinology.  LFTs were elevated back at the time of his appendicitis issue, but question if that was all reactive or possibly related to the portal vein thrombosis, etc.   Nonetheless they have normalized and were normal on last reading.   CC:  Harrie Jeans, MD

## 2021-11-18 ENCOUNTER — Other Ambulatory Visit (HOSPITAL_COMMUNITY): Payer: Self-pay

## 2021-12-20 ENCOUNTER — Other Ambulatory Visit: Payer: Self-pay | Admitting: Hematology and Oncology

## 2021-12-20 DIAGNOSIS — I81 Portal vein thrombosis: Secondary | ICD-10-CM

## 2021-12-20 NOTE — Progress Notes (Signed)
Rescheduled

## 2021-12-21 ENCOUNTER — Inpatient Hospital Stay: Payer: BC Managed Care – PPO

## 2021-12-21 ENCOUNTER — Inpatient Hospital Stay: Payer: BC Managed Care – PPO | Admitting: Hematology and Oncology

## 2021-12-21 ENCOUNTER — Other Ambulatory Visit (HOSPITAL_COMMUNITY): Payer: Self-pay

## 2021-12-21 DIAGNOSIS — I81 Portal vein thrombosis: Secondary | ICD-10-CM

## 2021-12-22 ENCOUNTER — Telehealth: Payer: Self-pay | Admitting: Hematology and Oncology

## 2021-12-22 NOTE — Telephone Encounter (Signed)
Sch per 1/9 inbasket, pt aware °

## 2022-01-05 ENCOUNTER — Other Ambulatory Visit (HOSPITAL_COMMUNITY): Payer: Self-pay

## 2022-01-11 ENCOUNTER — Other Ambulatory Visit: Payer: Self-pay | Admitting: Hematology and Oncology

## 2022-01-11 ENCOUNTER — Other Ambulatory Visit (HOSPITAL_COMMUNITY): Payer: Self-pay

## 2022-01-11 MED ORDER — APIXABAN 5 MG PO TABS
5.0000 mg | ORAL_TABLET | Freq: Two times a day (BID) | ORAL | 2 refills | Status: DC
  Filled 2022-01-11 – 2022-01-15 (×2): qty 60, 30d supply, fill #0
  Filled 2022-02-15: qty 60, 30d supply, fill #1
  Filled 2022-03-15: qty 60, 30d supply, fill #2

## 2022-01-15 ENCOUNTER — Other Ambulatory Visit (HOSPITAL_COMMUNITY): Payer: Self-pay

## 2022-01-15 ENCOUNTER — Inpatient Hospital Stay (HOSPITAL_BASED_OUTPATIENT_CLINIC_OR_DEPARTMENT_OTHER): Payer: BC Managed Care – PPO | Admitting: Hematology and Oncology

## 2022-01-15 ENCOUNTER — Other Ambulatory Visit: Payer: Self-pay

## 2022-01-15 ENCOUNTER — Inpatient Hospital Stay: Payer: BC Managed Care – PPO | Attending: Hematology and Oncology

## 2022-01-15 VITALS — BP 135/77 | HR 114 | Temp 98.1°F | Resp 17 | Ht 69.0 in | Wt 241.4 lb

## 2022-01-15 DIAGNOSIS — R160 Hepatomegaly, not elsewhere classified: Secondary | ICD-10-CM | POA: Insufficient documentation

## 2022-01-15 DIAGNOSIS — I81 Portal vein thrombosis: Secondary | ICD-10-CM | POA: Insufficient documentation

## 2022-01-15 DIAGNOSIS — Z8616 Personal history of COVID-19: Secondary | ICD-10-CM | POA: Insufficient documentation

## 2022-01-15 DIAGNOSIS — Z7901 Long term (current) use of anticoagulants: Secondary | ICD-10-CM | POA: Insufficient documentation

## 2022-01-15 DIAGNOSIS — K760 Fatty (change of) liver, not elsewhere classified: Secondary | ICD-10-CM | POA: Diagnosis not present

## 2022-01-15 LAB — CMP (CANCER CENTER ONLY)
ALT: 32 U/L (ref 0–44)
AST: 16 U/L (ref 15–41)
Albumin: 4.6 g/dL (ref 3.5–5.0)
Alkaline Phosphatase: 94 U/L (ref 38–126)
Anion gap: 7 (ref 5–15)
BUN: 15 mg/dL (ref 6–20)
CO2: 27 mmol/L (ref 22–32)
Calcium: 9.4 mg/dL (ref 8.9–10.3)
Chloride: 107 mmol/L (ref 98–111)
Creatinine: 0.6 mg/dL — ABNORMAL LOW (ref 0.61–1.24)
GFR, Estimated: >60 mL/min (ref >60)
Glucose, Bld: 86 mg/dL (ref 70–99)
Potassium: 3.9 mmol/L (ref 3.5–5.1)
Sodium: 141 mmol/L (ref 135–145)
Total Bilirubin: 0.3 mg/dL (ref 0.3–1.2)
Total Protein: 7.5 g/dL (ref 6.5–8.1)

## 2022-01-15 LAB — CBC WITH DIFFERENTIAL (CANCER CENTER ONLY)
Abs Immature Granulocytes: 0.03 10*3/uL (ref 0.00–0.07)
Basophils Absolute: 0 10*3/uL (ref 0.0–0.1)
Basophils Relative: 0 %
Eosinophils Absolute: 0.2 10*3/uL (ref 0.0–0.5)
Eosinophils Relative: 2 %
HCT: 38.3 % — ABNORMAL LOW (ref 39.0–52.0)
Hemoglobin: 12.2 g/dL — ABNORMAL LOW (ref 13.0–17.0)
Immature Granulocytes: 0 %
Lymphocytes Relative: 42 %
Lymphs Abs: 4 10*3/uL (ref 0.7–4.0)
MCH: 26.3 pg (ref 26.0–34.0)
MCHC: 31.9 g/dL (ref 30.0–36.0)
MCV: 82.7 fL (ref 80.0–100.0)
Monocytes Absolute: 0.8 10*3/uL (ref 0.1–1.0)
Monocytes Relative: 8 %
Neutro Abs: 4.5 10*3/uL (ref 1.7–7.7)
Neutrophils Relative %: 48 %
Platelet Count: 247 10*3/uL (ref 150–400)
RBC: 4.63 MIL/uL (ref 4.22–5.81)
RDW: 14.6 % (ref 11.5–15.5)
WBC Count: 9.5 10*3/uL (ref 4.0–10.5)
nRBC: 0 % (ref 0.0–0.2)

## 2022-01-15 NOTE — Progress Notes (Signed)
[facility] [phone]   [phone]  PROGRESS NOTE  Patient Care Team: Harrie Jeans, MD as PCP - General (Pediatrics) Sueanne Margarita, MD as PCP - Cardiology (Cardiology) Ottis Stain, MD as Pediatrician (Pediatrics)  Hematological/Oncological History # Portal Vein Thrombosis of Unclear Etiology 06/12/2021: patient presented to the ED with tachycardia and fever for 2-week period.  CT abdomen showed findings consistent with a perforated appendix.  Additionally there was noted to be a nonocclusive thrombus in the portal vein just beyond the portal mesenteric confluence.  There was notable hepatomegaly and hepatic steatosis.  CT angio study of the chest did not reveal any signs of pulmonary embolism 06/25/2021: establish care with Dr. Lorenso Courier  Interval History:  Martin Majestic 21 y.o. male with medical history significant for a portal vein thrombosis of unclear etiology who presents for a follow up visit. The patient's last visit was on 09/21/2021. In the interim since the last visit he is continued on his Eliquis therapy without difficulty.  On exam today Mr. Inabinet is accompanied by his mother.  He reports he has been tolerating Eliquis therapy well.  He did unfortunately recently learned that he had allergy to Tylenol where he broke out in hives/rash/bumps.  He notes that he unfortunately has been having a reoccurring headache and has been attempting to avoid ibuprofen and aspirin.  He notes that he was referred to [facility] for fatty liver clinic but notes that it is not feasible to go out there and visit with them.  He has had a couple nosebleeds here and there but overall has tolerated blood thinner well without major bleeding, bruising, or dark stools.  He thinks the dry air with heaters during the wintertime are causing his nosebleeds.  He is looking forward to a summer camp from June 5 until July 24, 2022.  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  He currently denies any fevers, chills, sweats, nausea, vomiting or diarrhea.  Full 10 point ROS is listed below.   MEDICAL HISTORY:  Past Medical History:  Diagnosis Date   ADHD (attention deficit hyperactivity disorder)    Anemia    Anxiety    COVID-19    Diabetes mellitus without complication (HCC)    Gross motor development delay    Pneumonia    PONV (postoperative nausea and vomiting)    Portal vein thrombosis    SCFE (slipped capital femoral epiphysis)    Speech delay    Tachycardia, unspecified    Velopharyngeal insufficiency, congenital     SURGICAL HISTORY: Past Surgical History:  Procedure Laterality Date   APPENDECTOMY     HARDWARE REMOVAL Bilateral 07/26/2019   Procedure: removal of hardware bilateral hips;  Surgeon: Meredith Pel, MD;  Location: [place];  Service: Orthopedics;  Laterality: Bilateral;   HIP PINNING,CANNULATED Bilateral 05/26/2017   Procedure: BILATERAL CANNULATED HIP PINNING;  Surgeon: Meredith Pel, MD;  Location: [place];  Service: Orthopedics;  Laterality: Bilateral;   HIP SURGERY Bilateral    LAPAROSCOPIC APPENDECTOMY N/A 09/23/2021   Procedure: APPENDECTOMY LAPAROSCOPIC;  Surgeon: Ralene Ok, MD;  Location: [facility];  Service: General;  Laterality: N/A;   PHARYNGEAL FLAP     PHARYNGEAL FLAP REVISION      SOCIAL HISTORY: Social History   Socioeconomic History   Marital status: Single    Spouse name: Not on file   Number of children: Not on file   Years of  education: Not on file   Highest education level: Not on file  Occupational History   Not on file  Tobacco Use   Smoking status: Never   Smokeless tobacco: Never  Vaping Use   Vaping Use: Never used  Substance and Sexual Activity   Alcohol use: No   Drug use: No   Sexual activity: Not on file  Other Topics Concern   Not on file  Social History Narrative   Lives with parents, twin brother, younger brother. [employer].       Will attend  [employer] in the fall, plans to major in business.    Social Determinants of Health   Financial Resource Strain: Not on file  Food Insecurity: Not on file  Transportation Needs: Not on file  Physical Activity: Not on file  Stress: Not on file  Social Connections: Not on file  Intimate Partner Violence: Not on file    FAMILY HISTORY: Family History  Problem Relation Age of Onset   Obesity Mother    Diabetes Father    Tall stature Father    Hypertension Father    Obesity Maternal Grandmother    Hypertension Maternal Grandmother    Diabetes Maternal Grandmother    Heart disease Paternal Grandmother    Diabetes Paternal Grandmother    Lung cancer Paternal Grandfather    Stomach cancer Neg Hx    Colon cancer Neg Hx    Esophageal cancer Neg Hx    Pancreatic cancer Neg Hx     ALLERGIES:  is allergic to vancomycin, guaifenesin & derivatives, lomotil [diphenoxylate-atropine], chlorhexidine, dayquil [pseudoephedrine-apap-dm], and tylenol [acetaminophen].  MEDICATIONS:  Current Outpatient Medications  Medication Sig Dispense Refill   Accu-Chek Softclix Lancets lancets USE AS DIRECTED TO TEST GLUCOSE LEVELS ONCE DAILY. 100 each 3   albuterol (VENTOLIN HFA) 108 (90 Base) MCG/ACT inhaler Inhale 2 puffs into the lungs every 6 (six) hours as needed for wheezing or shortness of breath. 6.7 g 0   apixaban (ELIQUIS) 5 MG TABS tablet Take 1 tablet (5 mg total) by mouth 2 (two) times daily. 60 tablet 2   glucose blood test strip Use to test blood glucose levels once daily. 100 each 3   metFORMIN (GLUCOPHAGE-XR) 500 MG 24 hr tablet Take 1 tablet (500 mg total) by mouth daily with breakfast. 90 tablet 3   No current facility-administered medications for this visit.    REVIEW OF SYSTEMS:   Constitutional: ( - ) fevers, ( - )  chills , ( - ) night sweats Eyes: ( - ) blurriness of vision, ( - ) double vision, ( - ) watery eyes Ears, nose, mouth, throat, and face: ( - ) mucositis, ( -  ) sore throat Respiratory: ( - ) cough, ( - ) dyspnea, ( - ) wheezes Cardiovascular: ( - ) palpitation, ( - ) chest discomfort, ( - ) lower extremity swelling Gastrointestinal:  ( - ) nausea, ( - ) heartburn, ( - ) change in bowel habits Skin: ( - ) abnormal skin rashes Lymphatics: ( - ) new lymphadenopathy, ( - ) easy bruising Neurological: ( - ) numbness, ( - ) tingling, ( - ) new weaknesses Behavioral/Psych: ( - ) mood change, ( - ) new changes  All other systems were reviewed with the patient and are negative.  PHYSICAL EXAMINATION: ECOG PERFORMANCE STATUS: 0 - Asymptomatic  Vitals:   01/15/22 1448  BP: 135/77  Pulse: (!) 114  Resp: 17  Temp: 98.1 F (36.7 C)  SpO2: 100%   Filed Weights   01/15/22 1448  Weight: 241 lb 6.4 oz (109.5 kg)    GENERAL: Well-appearing young Caucasian male, alert, no distress and comfortable SKIN: skin color, texture, turgor are normal, no rashes or significant lesions EYES: conjunctiva are pink and non-injected, sclera clear LUNGS: clear to auscultation and percussion with normal breathing effort HEART: regular rate & rhythm and no murmurs and no lower extremity edema Musculoskeletal: no cyanosis of digits and no clubbing  PSYCH: alert & oriented x 3, fluent speech NEURO: no focal motor/sensory deficits  LABORATORY DATA:  I have reviewed the data as listed CBC Latest Ref Rng & Units 01/15/2022 09/21/2021 06/25/2021  WBC 4.0 - 10.5 K/uL 9.5 9.4 10.2  Hemoglobin 13.0 - 17.0 g/dL 12.2(L) 12.4(L) 12.0(L)  Hematocrit 39.0 - 52.0 % 38.3(L) 37.1(L) 37.3(L)  Platelets 150 - 400 K/uL 247 260 257    CMP Latest Ref Rng & Units 01/15/2022 09/21/2021 06/25/2021  Glucose 70 - 99 mg/dL 86 96 99  BUN 6 - 20 mg/dL 15 12 13   Creatinine 0.61 - 1.24 mg/dL 0.60(L) 0.71 0.69  Sodium 135 - 145 mmol/L 141 143 141  Potassium 3.5 - 5.1 mmol/L 3.9 3.7 4.2  Chloride 98 - 111 mmol/L 107 108 106  CO2 22 - 32 mmol/L 27 26 27   Calcium 8.9 - 10.3 mg/dL 9.4 9.5 9.4   Total Protein 6.5 - 8.1 g/dL 7.5 7.5 7.8  Total Bilirubin 0.3 - 1.2 mg/dL 0.3 0.3 0.5  Alkaline Phos 38 - 126 U/L 94 112 102  AST 15 - 41 U/L 16 18 28   ALT 0 - 44 U/L 32 42 53(H)    RADIOGRAPHIC STUDIES: No results found.  ASSESSMENT & PLAN KORDELL JAFRI 21 y.o. male with medical history significant for a portal vein thrombosis of unclear etiology who presents for a follow up visit.   After review of the labs, review of the records, and discussion with the patient the patients findings are most consistent with a portal vein thrombus of unclear etiology.  The patient does have a perforated appendicitis which could potentially cause local inflammation, but these are not generally associated with portal vein thrombi.  Additionally he has hepatomegaly and hepatic steatosis of remarkable degree for a person of this age.  These constitute unusual findings and as such I recommended that we proceed with a hypercoagulable work-up to include JAK2 and PNH testing. This testing was negative.    I do believe the patient may benefit from evaluation by gastroenterology given his hepatomegaly and portal vein thrombus.  At this time I agree with continuation of Eliquis 5 mg twice daily.  If no clear etiology can be discerned we could consider this a provoked VTE in the setting of his perforated appendix, though we would have to have a discussion with the patient about the risks and benefits of discontinuing anticoagulation therapy.  # Portal Vein Thrombosis #Hepatomegaly/Hepatic Steatosis --negative hypercoagulable workup including FVL, prothrombin gene mutation, Antithrombin III, and antiphospholipid antibodies. --negative JAK2 and PNH testing --Recommend GI evaluation for this hepatomegaly and hepatic steatosis. On 10/28/2021 had a visit with a  GI APP.  --Continue Eliquis 5 mg twice daily  --Plan to have the patient return to clinic in 3 months time for labs and in 6 months for clinic visit   No  orders of the defined types were placed in this encounter.  All questions were answered. The patient knows to call the clinic with any problems, questions  or concerns.  A total of more than 30 minutes were spent on this encounter with face-to-face time and non-face-to-face time, including preparing to see the patient, ordering tests and/or medications, counseling the patient and coordination of care as outlined above.   Ledell Peoples, MD Department of Hematology/Oncology [place] [facility] Phone: [phone] Pager: [phone] Email: [email or web address]  01/19/2022 3:22 PM

## 2022-01-19 ENCOUNTER — Telehealth: Payer: Self-pay | Admitting: Hematology and Oncology

## 2022-01-19 NOTE — Telephone Encounter (Signed)
Scheduled per 2/3 los, pt has been called and confirmed

## 2022-02-05 ENCOUNTER — Ambulatory Visit (INDEPENDENT_AMBULATORY_CARE_PROVIDER_SITE_OTHER): Payer: BC Managed Care – PPO | Admitting: Endocrinology

## 2022-02-05 ENCOUNTER — Other Ambulatory Visit: Payer: Self-pay

## 2022-02-05 VITALS — BP 120/78 | HR 88 | Ht 69.0 in | Wt 242.6 lb

## 2022-02-05 DIAGNOSIS — E119 Type 2 diabetes mellitus without complications: Secondary | ICD-10-CM | POA: Diagnosis not present

## 2022-02-05 DIAGNOSIS — T383X5A Adverse effect of insulin and oral hypoglycemic [antidiabetic] drugs, initial encounter: Secondary | ICD-10-CM

## 2022-02-05 DIAGNOSIS — R7303 Prediabetes: Secondary | ICD-10-CM

## 2022-02-05 DIAGNOSIS — R197 Diarrhea, unspecified: Secondary | ICD-10-CM | POA: Diagnosis not present

## 2022-02-05 LAB — POCT GLYCOSYLATED HEMOGLOBIN (HGB A1C): Hemoglobin A1C: 6 % — AB (ref 4.0–5.6)

## 2022-02-05 NOTE — Patient Instructions (Addendum)
check your blood sugar once a day.  vary the time of day when you check, between before the 3 meals, and at bedtime.  also check if you have symptoms of your blood sugar being too high or too low.  please keep a record of the readings and bring it to your next appointment here (or you can bring the meter itself).  You can write it on any piece of paper.  please call us sooner if your blood sugar goes below 70, or if most of your readings are over 200.   Please continue the same metformin.   Please come back for a follow-up appointment in 6 months.

## 2022-02-05 NOTE — Progress Notes (Signed)
Subjective:    Patient ID: Juan Valencia, male    DOB: Jan 31, 2001, 21 y.o.   MRN: [id number]  HPI Pt returns for f/u of diabetes mellitus: DM type: 2 Dx'ed: 3810 Complications: none Therapy: metformin DKA: never Severe hypoglycemia: never Pancreatitis: never Pancreatic imaging: normal on 2022 CT SDOH: none Other: He took [facility] for a few mos in 2018, and testosterone x a few months in 2019;  I told pt he would have to take metformin first, in order to qualify for GLP rx.   Interval history: Pt says cbg varies from 110-130.  pt states he feels well in general.  He says metformin causes intermitt diarrhea.   Past Medical History:  Diagnosis Date   ADHD (attention deficit hyperactivity disorder)    Anemia    Anxiety    COVID-19    Diabetes mellitus without complication (HCC)    Gross motor development delay    Pneumonia    PONV (postoperative nausea and vomiting)    Portal vein thrombosis    SCFE (slipped capital femoral epiphysis)    Speech delay    Tachycardia, unspecified    Velopharyngeal insufficiency, congenital     Past Surgical History:  Procedure Laterality Date   APPENDECTOMY     HARDWARE REMOVAL Bilateral 07/26/2019   Procedure: removal of hardware bilateral hips;  Surgeon: Meredith Pel, MD;  Location: [place];  Service: Orthopedics;  Laterality: Bilateral;   HIP PINNING,CANNULATED Bilateral 05/26/2017   Procedure: BILATERAL CANNULATED HIP PINNING;  Surgeon: Meredith Pel, MD;  Location: [place];  Service: Orthopedics;  Laterality: Bilateral;   HIP SURGERY Bilateral    LAPAROSCOPIC APPENDECTOMY N/A 09/23/2021   Procedure: APPENDECTOMY LAPAROSCOPIC;  Surgeon: Ralene Ok, MD;  Location: [facility];  Service: General;  Laterality: N/A;   PHARYNGEAL FLAP     PHARYNGEAL FLAP REVISION      Social History   Socioeconomic History   Marital status: Single    Spouse name: Not on file   Number of children: Not on file   Years of education: Not on file    Highest education level: Not on file  Occupational History   Not on file  Tobacco Use   Smoking status: Never   Smokeless tobacco: Never  Vaping Use   Vaping Use: Never used  Substance and Sexual Activity   Alcohol use: No   Drug use: No   Sexual activity: Not on file  Other Topics Concern   Not on file  Social History Narrative   Lives with parents, twin brother, younger brother. [employer].       Will attend [employer] in the fall, plans to major in business.    Social Determinants of Health   Financial Resource Strain: Not on file  Food Insecurity: Not on file  Transportation Needs: Not on file  Physical Activity: Not on file  Stress: Not on file  Social Connections: Not on file  Intimate Partner Violence: Not on file    Current Outpatient Medications on File Prior to Visit  Medication Sig Dispense Refill   Accu-Chek Softclix Lancets lancets USE AS DIRECTED TO TEST GLUCOSE LEVELS ONCE DAILY. 100 each 3   albuterol (VENTOLIN HFA) 108 (90 Base) MCG/ACT inhaler Inhale 2 puffs into the lungs every 6 (six) hours as needed for wheezing or shortness of breath. 6.7 g 0   apixaban (ELIQUIS) 5 MG TABS tablet Take 1 tablet (5 mg total) by mouth 2 (two) times daily. 60 tablet 2  glucose blood test strip Use to test blood glucose levels once daily. 100 each 3   metFORMIN (GLUCOPHAGE-XR) 500 MG 24 hr tablet Take 1 tablet (500 mg total) by mouth daily with breakfast. 90 tablet 3   No current facility-administered medications on file prior to visit.    Allergies  Allergen Reactions   Vancomycin Swelling    Lip swelling, red and puffy, burning in groin/genital region   Guaifenesin & Derivatives Hives    Reaction was to either guaifenesin or dayquil   Lomotil [Diphenoxylate-Atropine] Hives    Possible reaction to Lomotil - 07/25/2020   Chlorhexidine Rash   Dayquil [Pseudoephedrine-Apap-Dm] Hives    Reaction was to either guaifenesin or dayquil    Tylenol [Acetaminophen]  Hives    Family History  Problem Relation Age of Onset   Obesity Mother    Diabetes Father    Tall stature Father    Hypertension Father    Obesity Maternal Grandmother    Hypertension Maternal Grandmother    Diabetes Maternal Grandmother    Heart disease Paternal Grandmother    Diabetes Paternal Grandmother    Lung cancer Paternal Grandfather    Stomach cancer Neg Hx    Colon cancer Neg Hx    Esophageal cancer Neg Hx    Pancreatic cancer Neg Hx     BP 120/78    Pulse 88    Ht 5\' 9"  (1.753 m)    Wt 242 lb 9.6 oz (110 kg)    SpO2 98%    BMI 35.83 kg/m    Review of Systems     Objective:   Physical Exam  Lab Results  Component Value Date   CREATININE 0.60 (L) 01/15/2022   BUN 15 01/15/2022   NA 141 01/15/2022   K 3.9 01/15/2022   CL 107 01/15/2022   CO2 27 01/15/2022    Lab Results  Component Value Date   HGBA1C 6.0 (A) 02/05/2022      Assessment & Plan:  Type 2 DM: well-controlled.  Diarrhea, due to metformin.  We discussed.  He chooses to continue.   Patient Instructions  check your blood sugar once a day.  vary the time of day when you check, between before the 3 meals, and at bedtime.  also check if you have symptoms of your blood sugar being too high or too low.  please keep a record of the readings and bring it to your next appointment here (or you can bring the meter itself).  You can write it on any piece of paper.  please call us sooner if your blood sugar goes below 70, or if most of your readings are over 200.   Please continue the same metformin.   Please come back for a follow-up appointment in 6 months.

## 2022-02-15 ENCOUNTER — Other Ambulatory Visit (HOSPITAL_COMMUNITY): Payer: Self-pay

## 2022-03-15 ENCOUNTER — Other Ambulatory Visit (HOSPITAL_COMMUNITY): Payer: Self-pay

## 2022-04-12 DIAGNOSIS — I81 Portal vein thrombosis: Secondary | ICD-10-CM | POA: Diagnosis not present

## 2022-04-12 DIAGNOSIS — M93002 Unspecified slipped upper femoral epiphysis (nontraumatic), left hip: Secondary | ICD-10-CM | POA: Diagnosis not present

## 2022-04-12 DIAGNOSIS — Z6841 Body Mass Index (BMI) 40.0 and over, adult: Secondary | ICD-10-CM | POA: Diagnosis not present

## 2022-04-12 DIAGNOSIS — E119 Type 2 diabetes mellitus without complications: Secondary | ICD-10-CM | POA: Diagnosis not present

## 2022-04-13 ENCOUNTER — Other Ambulatory Visit: Payer: Self-pay | Admitting: *Deleted

## 2022-04-13 ENCOUNTER — Telehealth: Payer: Self-pay | Admitting: *Deleted

## 2022-04-13 DIAGNOSIS — I81 Portal vein thrombosis: Secondary | ICD-10-CM

## 2022-04-13 NOTE — Telephone Encounter (Signed)
Patient and his mother called office with following concerns and questions.  ?She asked about lab tests ordered for Friday appt and if appt was just for labs only or if he was also seeing Dr. Lorenso Courier. Informed her that labs only this appt and labs/MD next appts in August. Informed her that per Dr. Lorenso Courier, CBC and CMP ordered. Ms. Steeves also asked if a scan had been ordered. Informed her that no scan ordered at this time.  ? 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  She expressed concerns about him being on a blood thinner while working in a camp 4 hours away.  ? ?Ms. Macomber said she wants to know what information is needed before the medication can be stopped: ?Will he blood tests being done on Friday determine if he can stop the medicine?  ?Would a scan determine if the clot is still there? And if it is not, does he still need the blood thinner? ? ?Informed her that all these questions will be sent to Dr. Lorenso Courier. She verbalized understanding.   ? ? ?

## 2022-04-16 ENCOUNTER — Other Ambulatory Visit: Payer: Self-pay | Admitting: Hematology and Oncology

## 2022-04-16 ENCOUNTER — Other Ambulatory Visit: Payer: Self-pay | Admitting: *Deleted

## 2022-04-16 ENCOUNTER — Other Ambulatory Visit: Payer: Self-pay

## 2022-04-16 ENCOUNTER — Telehealth: Payer: Self-pay | Admitting: *Deleted

## 2022-04-16 ENCOUNTER — Inpatient Hospital Stay: Payer: BC Managed Care – PPO | Attending: Hematology and Oncology

## 2022-04-16 ENCOUNTER — Other Ambulatory Visit (HOSPITAL_COMMUNITY): Payer: Self-pay

## 2022-04-16 DIAGNOSIS — K760 Fatty (change of) liver, not elsewhere classified: Secondary | ICD-10-CM | POA: Insufficient documentation

## 2022-04-16 DIAGNOSIS — I81 Portal vein thrombosis: Secondary | ICD-10-CM | POA: Diagnosis not present

## 2022-04-16 LAB — CBC WITH DIFFERENTIAL (CANCER CENTER ONLY)
Abs Immature Granulocytes: 0.04 10*3/uL (ref 0.00–0.07)
Basophils Absolute: 0 10*3/uL (ref 0.0–0.1)
Basophils Relative: 0 %
Eosinophils Absolute: 0.4 10*3/uL (ref 0.0–0.5)
Eosinophils Relative: 4 %
HCT: 39.6 % (ref 39.0–52.0)
Hemoglobin: 12.6 g/dL — ABNORMAL LOW (ref 13.0–17.0)
Immature Granulocytes: 0 %
Lymphocytes Relative: 43 %
Lymphs Abs: 4.4 10*3/uL — ABNORMAL HIGH (ref 0.7–4.0)
MCH: 26.4 pg (ref 26.0–34.0)
MCHC: 31.8 g/dL (ref 30.0–36.0)
MCV: 83 fL (ref 80.0–100.0)
Monocytes Absolute: 0.7 10*3/uL (ref 0.1–1.0)
Monocytes Relative: 7 %
Neutro Abs: 4.6 10*3/uL (ref 1.7–7.7)
Neutrophils Relative %: 46 %
Platelet Count: 262 10*3/uL (ref 150–400)
RBC: 4.77 MIL/uL (ref 4.22–5.81)
RDW: 13.8 % (ref 11.5–15.5)
WBC Count: 10.2 10*3/uL (ref 4.0–10.5)
nRBC: 0 % (ref 0.0–0.2)

## 2022-04-16 LAB — CMP (CANCER CENTER ONLY)
ALT: 28 U/L (ref 0–44)
AST: 14 U/L — ABNORMAL LOW (ref 15–41)
Albumin: 4.4 g/dL (ref 3.5–5.0)
Alkaline Phosphatase: 88 U/L (ref 38–126)
Anion gap: 6 (ref 5–15)
BUN: 13 mg/dL (ref 6–20)
CO2: 27 mmol/L (ref 22–32)
Calcium: 9.1 mg/dL (ref 8.9–10.3)
Chloride: 108 mmol/L (ref 98–111)
Creatinine: 0.66 mg/dL (ref 0.61–1.24)
GFR, Estimated: >60 mL/min (ref >60)
Glucose, Bld: 102 mg/dL — ABNORMAL HIGH (ref 70–99)
Potassium: 4 mmol/L (ref 3.5–5.1)
Sodium: 141 mmol/L (ref 135–145)
Total Bilirubin: 0.3 mg/dL (ref 0.3–1.2)
Total Protein: 7.5 g/dL (ref 6.5–8.1)

## 2022-04-16 MED ORDER — APIXABAN 5 MG PO TABS
5.0000 mg | ORAL_TABLET | Freq: Two times a day (BID) | ORAL | 2 refills | Status: DC
  Filled 2022-04-16: qty 60, 30d supply, fill #0
  Filled 2022-05-17: qty 60, 30d supply, fill #1
  Filled 2022-06-16: qty 60, 30d supply, fill #2

## 2022-04-16 NOTE — Telephone Encounter (Signed)
Received call from pt regarding  questions he had earlier in the week.  Pt is currently on eliquis for portal vein thrombosis. He is going to a summer camp in a week and his mother is asking if he can stop his Eliquis as she is worried about any accidents that may happen at camp and subsequent bleeding. ?Jacorion states they want to know if the clot is gone.  Advised that we can certainly do an [place] of his liver to check on the status of the clot. But advised that whether the clot is present or not does not change his current therapy. The Eliquis is to prevent future clots, not dissolve the previous clot. Dr. Lorenso Courier has recommended he stay on the Eliquis but if pt or mother insists on stopping the eliquis, pt would need to be on daily Asprin 325 mg. ?[place] of liver ordered and hopefully pt can have this scheduled next week. Pt to call [phone] to schedule this.  He will discuss with his mother the recommendations of Dr. Lorenso Courier and either his mother or pt will call back. ?

## 2022-04-18 IMAGING — CT CT ABD-PELV W/ CM
2 of 4 series · 15 of 46 positions shown, 17 images · IV contrast (omnipaque)
Comparison: None.

CLINICAL DATA: Sepsis.  Fever of unknown origin.  Weakness.

EXAM:
CT ABDOMEN AND PELVIS WITH CONTRAST
TECHNIQUE: Multidetector CT imaging of the abdomen and pelvis was performed
using the standard protocol following bolus administration of
intravenous contrast.
CONTRAST:  100mL OMNIPAQUE IOHEXOL 350 MG/ML SOLN

[Series 6: a/p w/ 5mm · axial · 0.97mm/px · z∈[+878,+1398]mm · 12 of 114 slices shown, 14 images]
[im 5/114  soft-tissue]
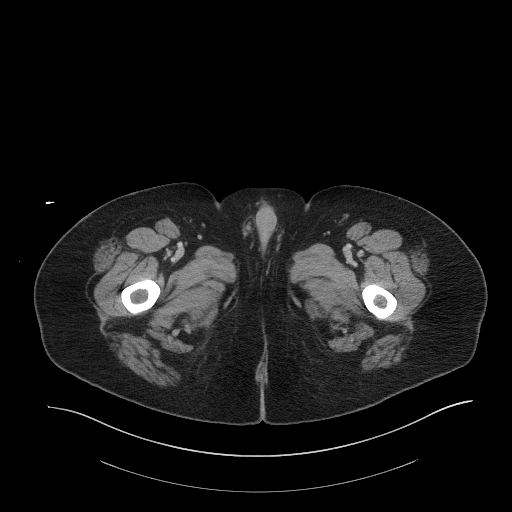
[im 5/114  bone]
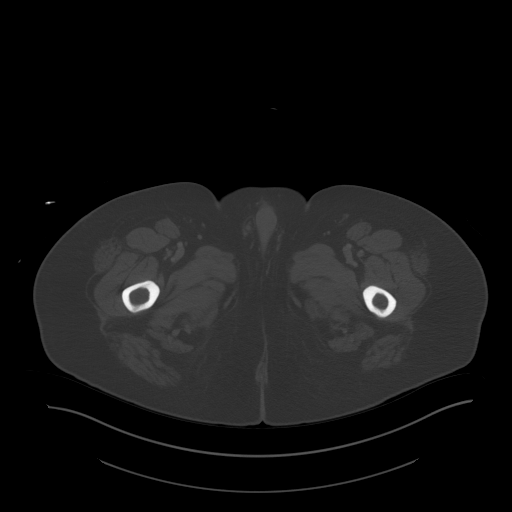
[im 15/114  soft-tissue]
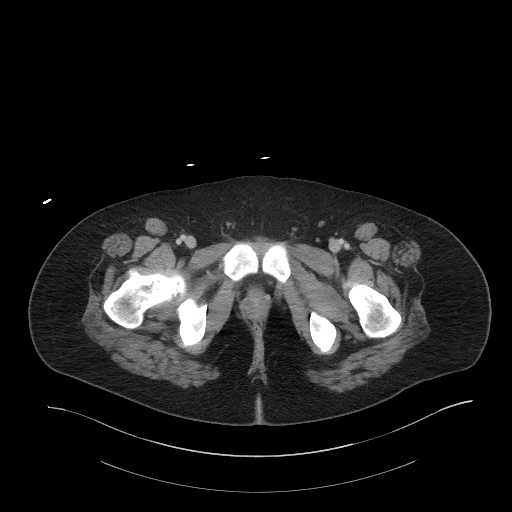
[im 24/114  soft-tissue]
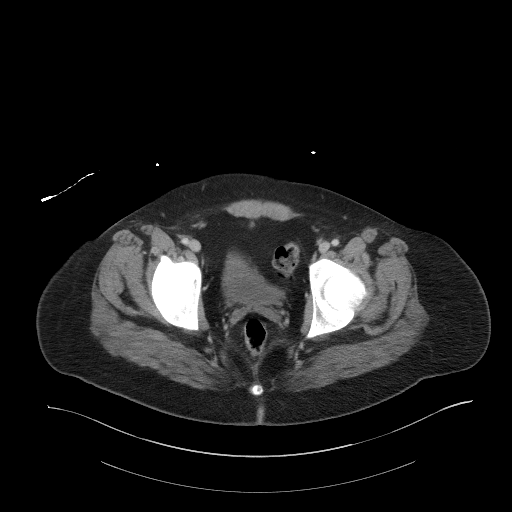
[im 33/114  soft-tissue]
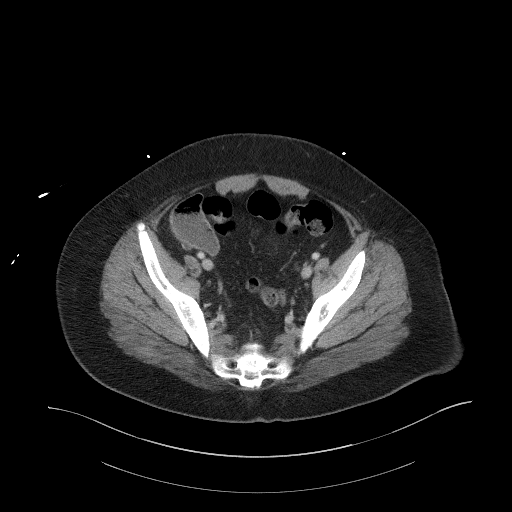
[im 43/114  soft-tissue]
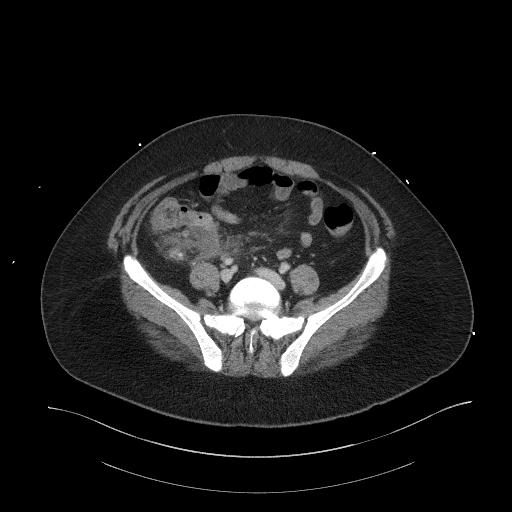
[im 52/114  soft-tissue]
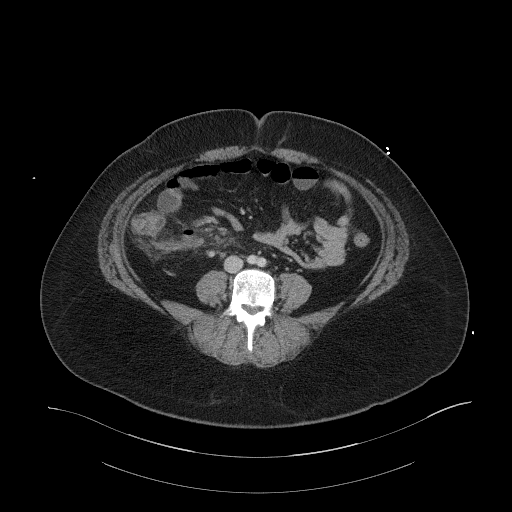
[im 62/114  soft-tissue]
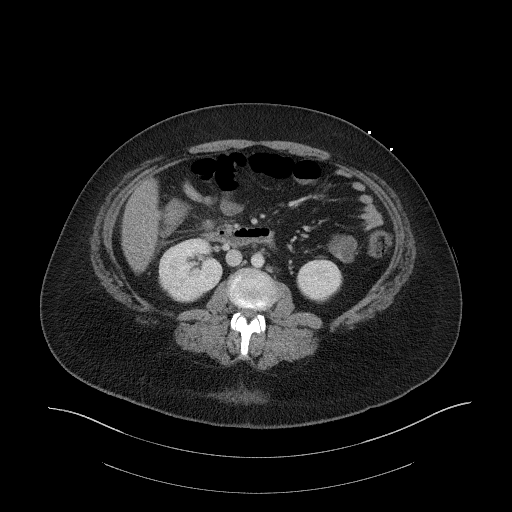
[im 71/114  soft-tissue]
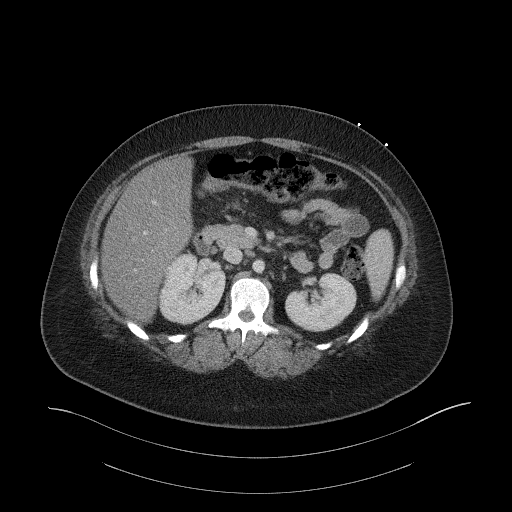
[im 81/114  soft-tissue]
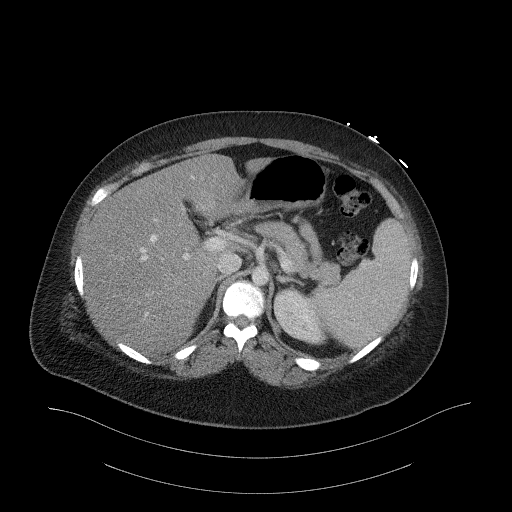
[im 81/114  bone]
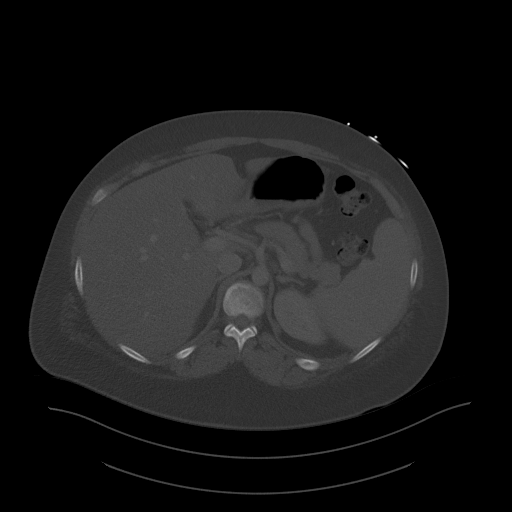
[im 90/114  soft-tissue]
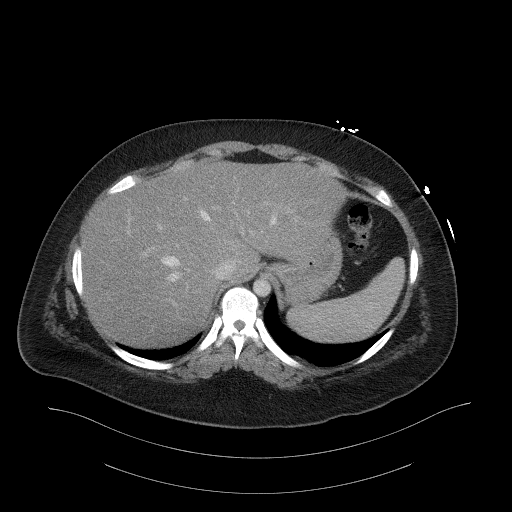
[im 99/114  soft-tissue]
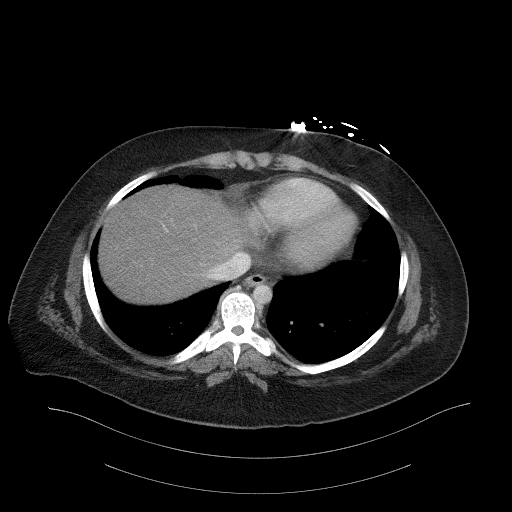
[im 109/114  soft-tissue]
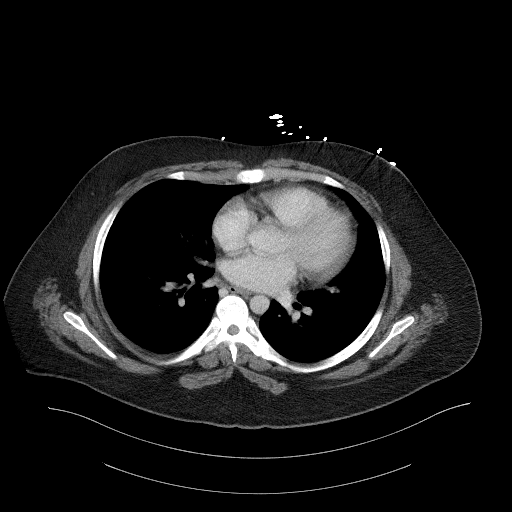

[Series 10: a/p w/ cor · coronal · 0.94mm/px · 3 of 184 slices shown]
[im 62/184  soft-tissue]
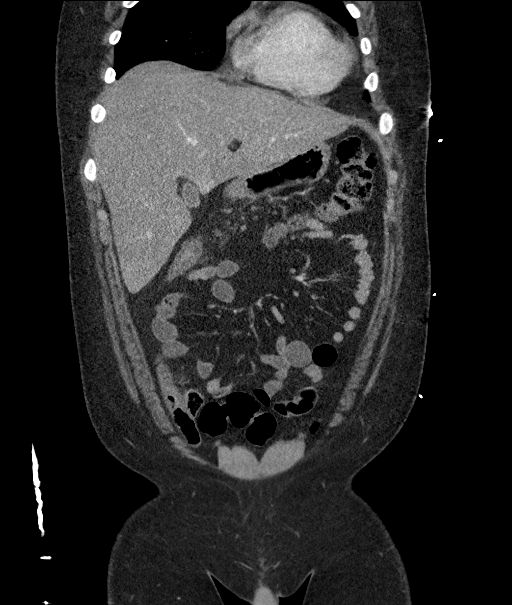
[im 82/184  soft-tissue]
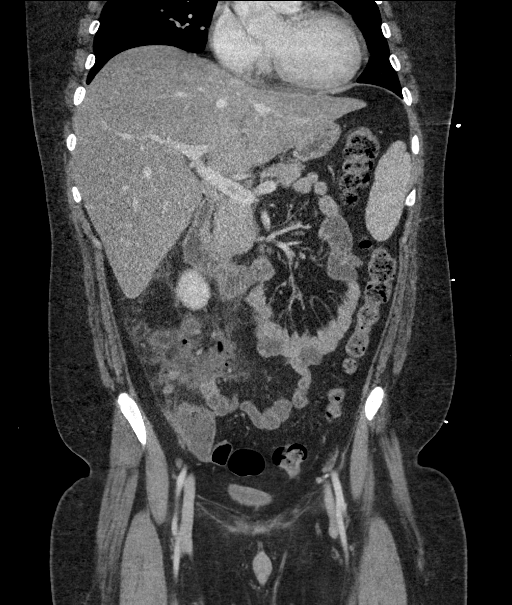
[im 102/184  soft-tissue]
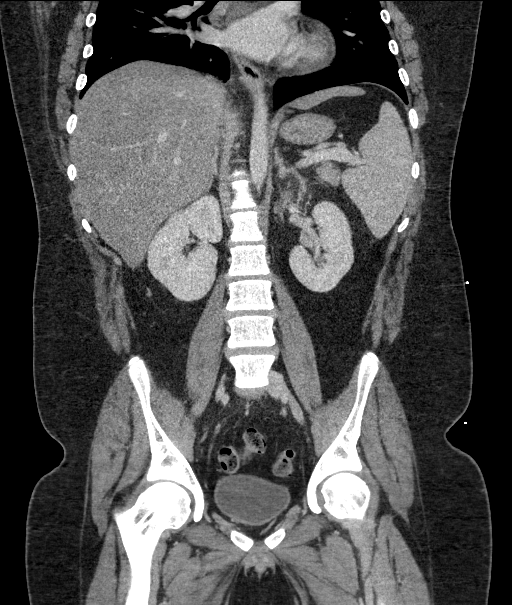

[15 of 46 positions shown; findings below may reference images not displayed]

FINDINGS: Lower chest: Assessed on concurrent chest CTA, reported separately

Hepatobiliary: Enlarged liver spanning 23.7 cm cranial caudal.
Diffusely decreased hepatic density typical of steatosis. There is
focal fatty sparing adjacent to the gallbladder fossa. Partially
distended gallbladder. There is no pericholecystic inflammation or
calcified gallstone.

Pancreas: No ductal dilatation or inflammation.

Spleen: Mildly enlarged spanning 13.3 cm AP. No focal splenic
abnormality.

Adrenals/Urinary Tract: Normal adrenal glands. Homogeneous renal
enhancement without hydronephrosis. No visualized renal calculi.
Partially distended urinary bladder. No bladder wall thickening.

Stomach/Bowel: Findings highly suspicious for perforated
appendicitis. There is a 12 x 16 mm calcification likely an
appendicolith which is likely in the proximal appendix, through the
distal appendix is not well-defined, and there is ill-defined
extraluminal air, fluid, and inflammatory change. Mild wall
thickening of the ascending colon, felt to be reactive. Occasional
fluid-filled loops of small bowel likely reactive ileus. There is no
small bowel obstruction. Formed stool in the distal transverse and
descending colon. Unremarkable stomach.

Vascular/Lymphatic: Nonocclusive thrombus in the portal vein just
beyond the portal mesenteric confluence. No definite thrombus within
the mesenteric veins. There are multiple prominent ileocolic lymph
nodes. Normal caliber abdominal aorta.

Reproductive: Prostate is unremarkable.

Other: Inflammatory fat stranding in the right lower quadrant with
non organized free fluid and extraluminal air, free air is seen for
example series 6, images 61 through 70. There is no drainable
collection. No free air tracks beyond the right lower quadrant
mesentery.

Musculoskeletal: Ghost tracks within both femoral head neck
junctions. No acute osseous abnormalities are seen.
IMPRESSION: 1. Findings highly suspicious for perforated appendicitis. There is
a 12 x 16 mm calcification in the right lower quadrant that is
likely in the proximal appendix, through the appendix is not
well-defined, and there is ill-defined extraluminal air, fluid, and
inflammatory change adjacent to the cecum. No organized drainable
collection.
2. Nonocclusive thrombus in the portal vein just beyond the portal
mesenteric confluence.
3. Hepatosplenomegaly and hepatic steatosis.

These results were called by telephone at the time of interpretation
on 06/12/2021 at [DATE] to provider KENEDY HORNE , who verbally
acknowledged these results.

## 2022-04-19 IMAGING — US US HEPATIC LIVER DOPPLER
1 series · 13 of 25 positions shown · non-contrast
Comparison: None.

CLINICAL DATA: Elevated LFTs

EXAM:
DUPLEX ULTRASOUND OF LIVER
TECHNIQUE: Color and duplex Doppler ultrasound was performed to evaluate the
hepatic in-flow and out-flow vessels.

[Series 1: us liver doppler · 13 of 96 slices shown]
[im 1/96]
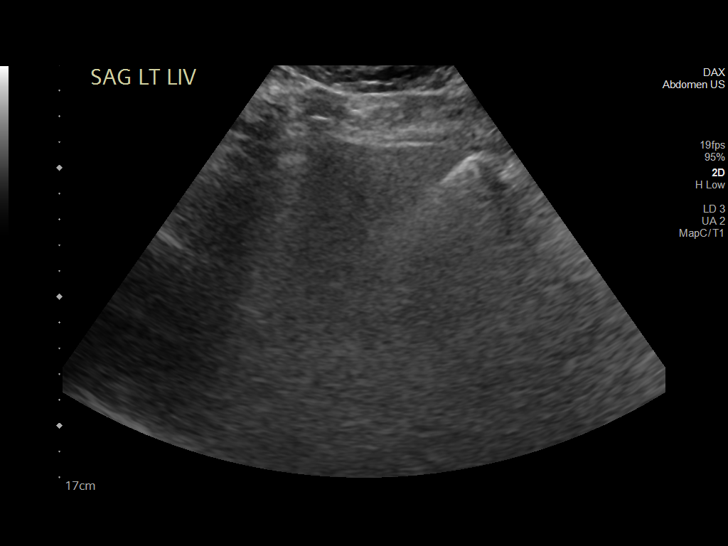
[im 8/96]
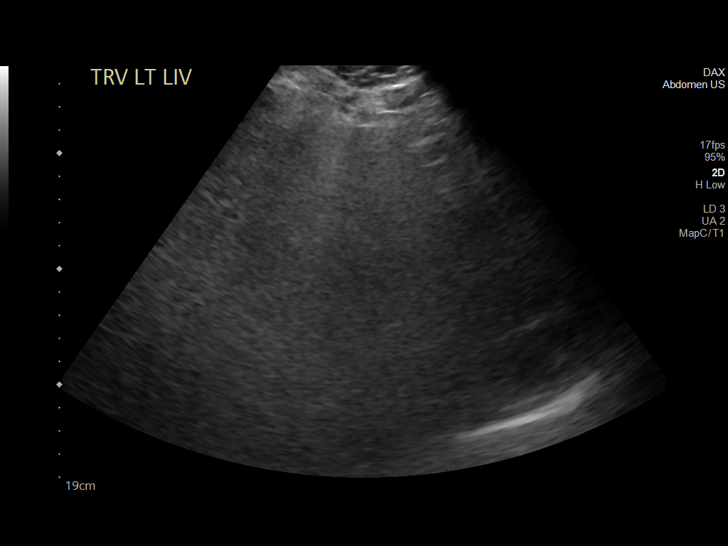
[im 16/96]
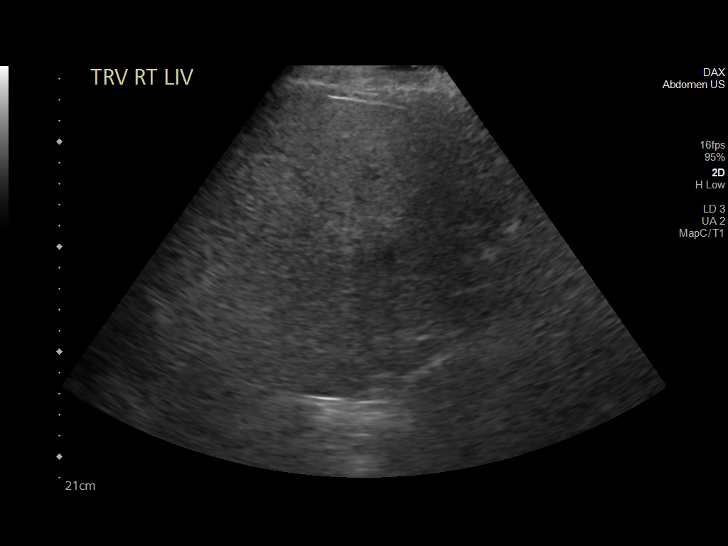
[im 24/96]
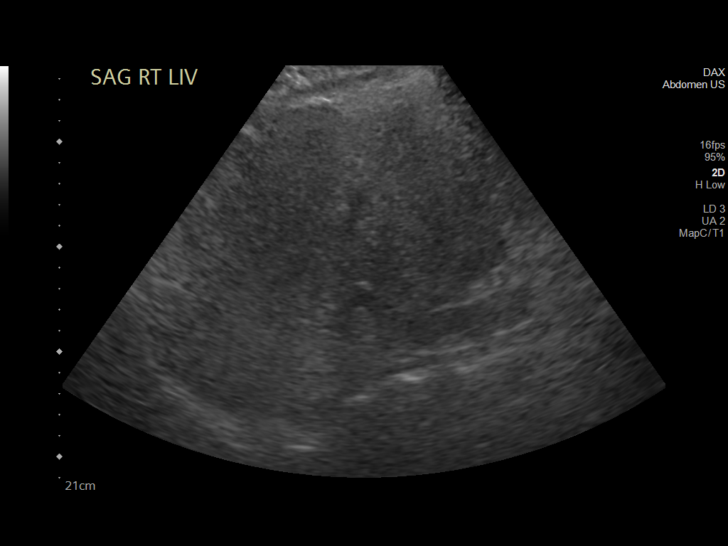
[im 32/96]
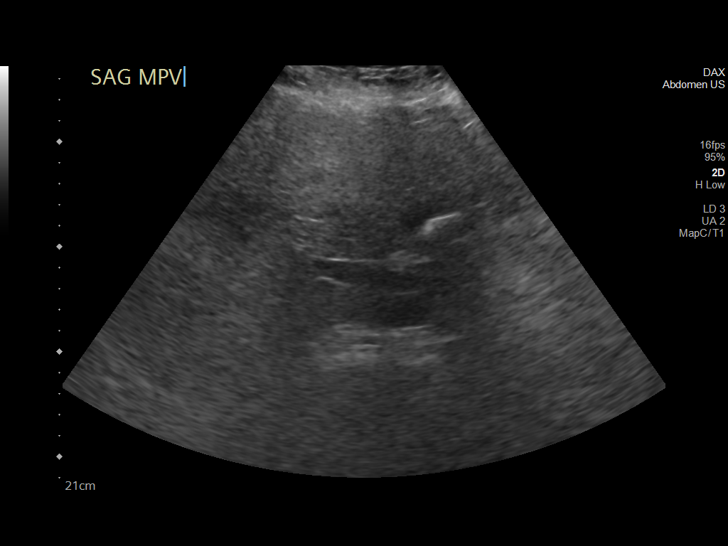
[im 40/96]
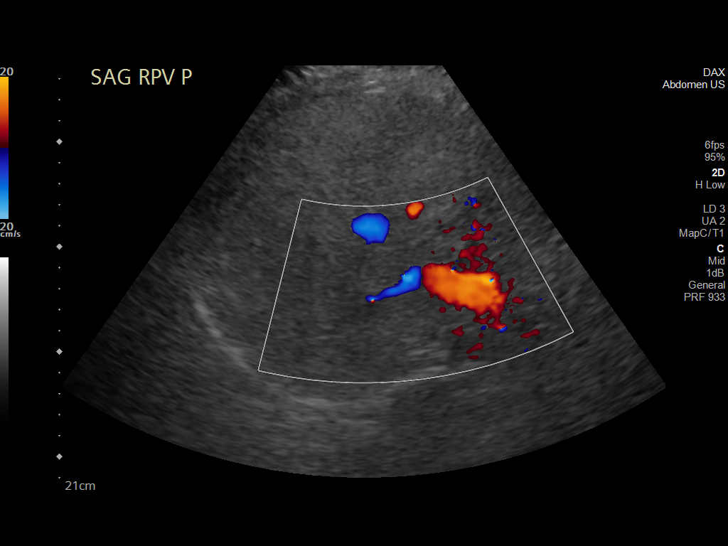
[im 48/96]
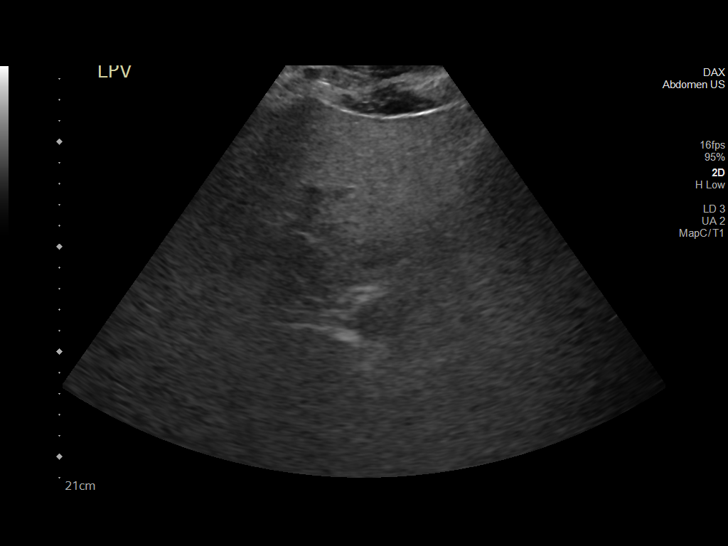
[im 56/96]
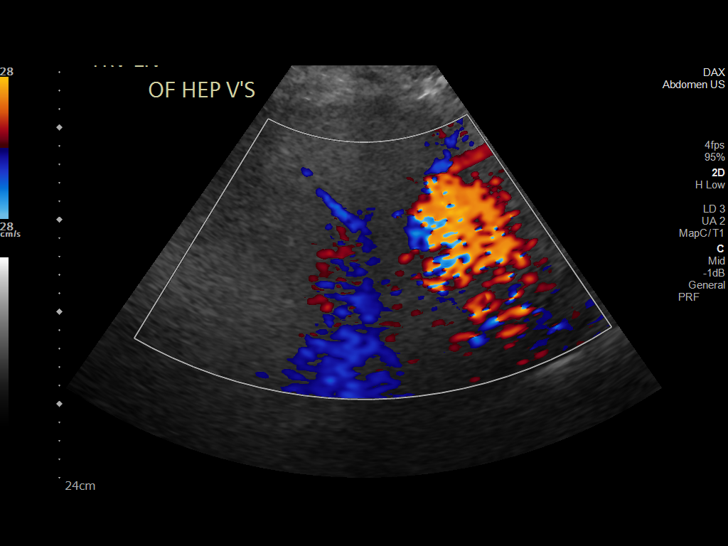
[im 64/96]
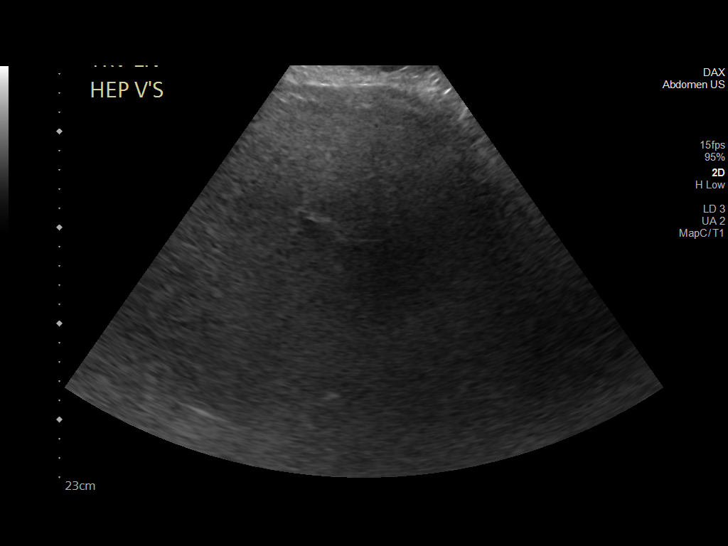
[im 72/96]
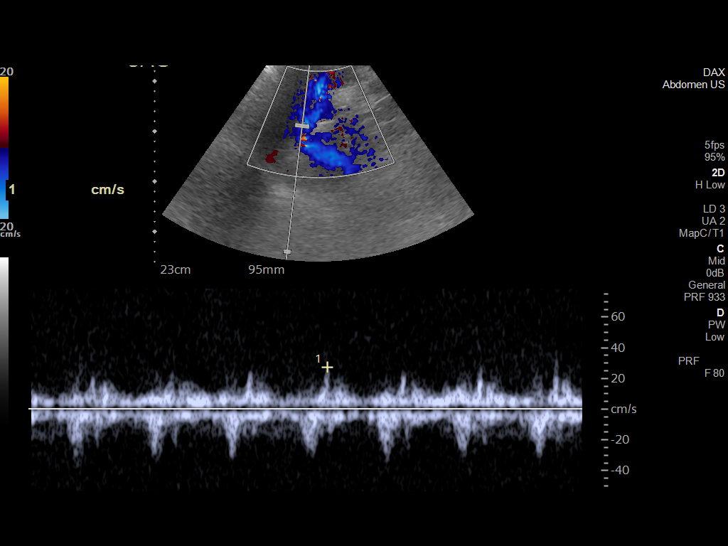
[im 80/96]
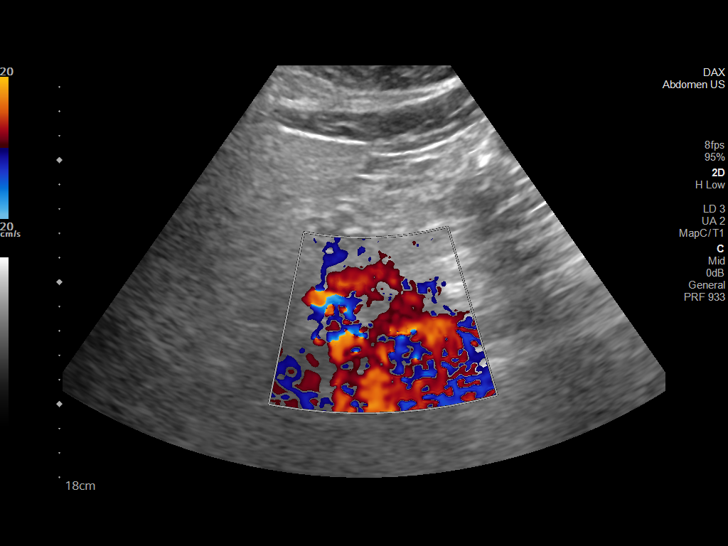
[im 88/96]
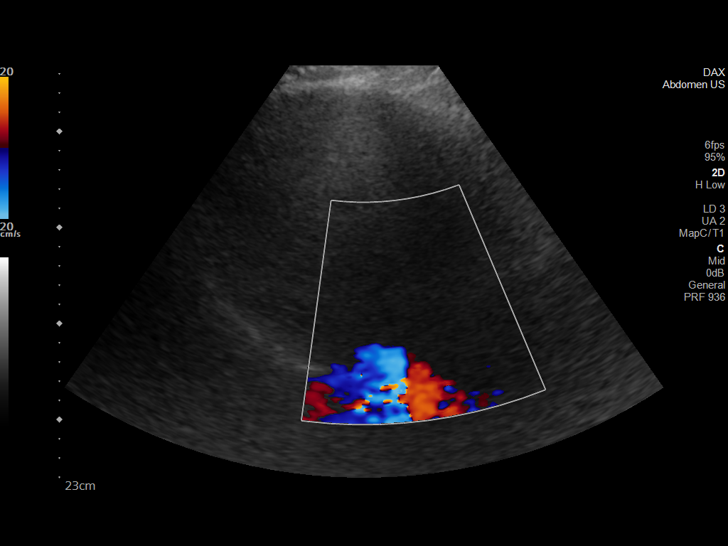
[im 96/96]
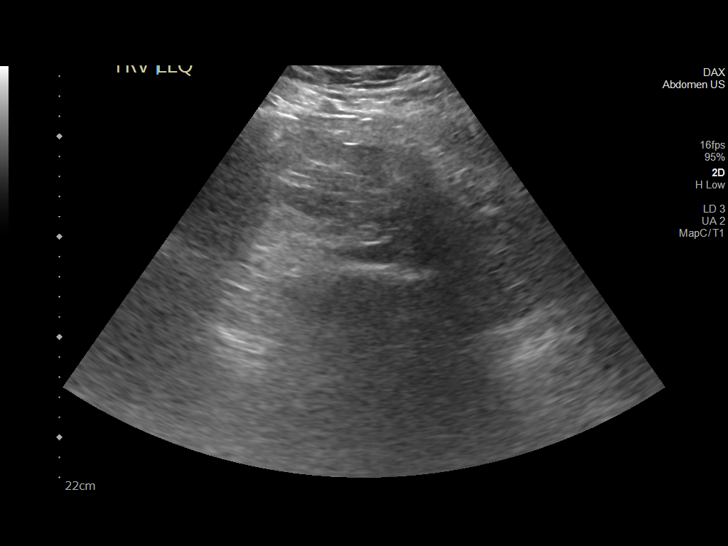

[13 of 25 positions shown; findings below may reference images not displayed]

FINDINGS: Liver: Heterogeneous and echogenic hepatic parenchyma with
coarsening of the echotexture. The adjacent renal parenchyma appears
hypoechoic in comparison. Normal hepatic contour without nodularity.

No focal lesion, mass or intrahepatic biliary ductal dilatation.

Main Portal Vein size: 0.8 cm

Portal Vein Velocities

Main Prox:  34 cm/sec with normal hepatopetal directional flow

Main Mid: 35 cm/sec with normal hepatopetal directional flow

Main Dist:  25 cm/sec with normal hepatopetal directional flow
Right: 25 cm/sec with normal hepatopetal directional flow
Left: 35 cm/sec with normal hepatopetal directional flow

Hepatic Vein Velocities

Right:  32 cm/sec

Middle:  33 cm/sec

Left:  42 cm/sec

IVC: Present and patent with normal respiratory phasicity.

Hepatic Artery Velocity:  Not visualized

Splenic Vein Velocity:  27 cm/sec

Spleen: 13.5 cm x 15.2 cm x 5.7 cm with a total volume of 208 cm^3
(411 cm^3 is upper limit normal)

Portal Vein Occlusion/Thrombus: No

Splenic Vein Occlusion/Thrombus: No

Ascites: None

Varices: None
IMPRESSION: 1. Advanced hepatic steatosis.
2. Patent portal veins with normal directional flow.

## 2022-04-21 ENCOUNTER — Telehealth: Payer: Self-pay | Admitting: *Deleted

## 2022-04-21 ENCOUNTER — Ambulatory Visit (HOSPITAL_COMMUNITY)
Admission: RE | Admit: 2022-04-21 | Discharge: 2022-04-21 | Disposition: A | Discharge status: Home / Self Care | Payer: BC Managed Care – PPO | Source: Ambulatory Visit | Attending: Hematology and Oncology | Admitting: Hematology and Oncology

## 2022-04-21 DIAGNOSIS — I82890 Acute embolism and thrombosis of other specified veins: Secondary | ICD-10-CM | POA: Diagnosis not present

## 2022-04-21 DIAGNOSIS — I81 Portal vein thrombosis: Secondary | ICD-10-CM | POA: Insufficient documentation

## 2022-04-21 DIAGNOSIS — K7689 Other specified diseases of liver: Secondary | ICD-10-CM | POA: Diagnosis not present

## 2022-04-21 DIAGNOSIS — R188 Other ascites: Secondary | ICD-10-CM | POA: Diagnosis not present

## 2022-04-21 NOTE — Telephone Encounter (Signed)
Juan Valencia regarding recent US.   Spoke with him and advised that the [place] did not show any evidence of the portal vein thrombus. Advised that Dr. Lorenso Courier was reassured by this and that if Juan Valencia wanted to stop the Eliquis he could but would need to take ASA 81 mg daily. Juan Valencia siad he has decided to stay on the Eliquis and will re-evaluate his situation with Dr. Lorenso Courier in August. Juan Valencia is aware of the precautions he needs to take at camp this summer. ?

## 2022-04-21 NOTE — Telephone Encounter (Signed)
-----   Message from Orson Slick, MD sent at 04/21/2022  1:54 PM EDT ----- ?Please let Mr. Maday know that there is no evidence of residual thrombus in the portal vein. This is reassuring. If he would like to d/c anticoagulation we could continue ASA '81mg'$  PO daily as a more mild form of thromboprophylaxis.  ? ?----- Message ----- ?From: Interface, Rad Results In ?Sent: 04/21/2022  12:53 PM EDT ?To: Orson Slick, MD ? ? ?

## 2022-05-07 ENCOUNTER — Telehealth: Payer: Self-pay | Admitting: Hematology and Oncology

## 2022-05-07 NOTE — Telephone Encounter (Signed)
Per provider reschedule called and left message about appointment reschedule details and call back number were left

## 2022-05-17 ENCOUNTER — Other Ambulatory Visit (HOSPITAL_COMMUNITY): Payer: Self-pay

## 2022-06-16 ENCOUNTER — Other Ambulatory Visit (HOSPITAL_COMMUNITY): Payer: Self-pay

## 2022-07-19 ENCOUNTER — Ambulatory Visit: Payer: BC Managed Care – PPO | Admitting: Hematology and Oncology

## 2022-07-19 ENCOUNTER — Other Ambulatory Visit: Payer: BC Managed Care – PPO

## 2022-07-20 ENCOUNTER — Other Ambulatory Visit (HOSPITAL_COMMUNITY): Payer: Self-pay

## 2022-07-20 ENCOUNTER — Other Ambulatory Visit: Payer: Self-pay | Admitting: Hematology and Oncology

## 2022-07-20 MED ORDER — APIXABAN 5 MG PO TABS
5.0000 mg | ORAL_TABLET | Freq: Two times a day (BID) | ORAL | 2 refills | Status: AC
  Filled 2022-07-20: qty 60, 30d supply, fill #0

## 2022-07-29 DIAGNOSIS — I81 Portal vein thrombosis: Secondary | ICD-10-CM | POA: Diagnosis not present

## 2022-07-29 DIAGNOSIS — E119 Type 2 diabetes mellitus without complications: Secondary | ICD-10-CM | POA: Diagnosis not present

## 2022-07-29 DIAGNOSIS — D649 Anemia, unspecified: Secondary | ICD-10-CM | POA: Diagnosis not present

## 2022-07-29 DIAGNOSIS — Z13220 Encounter for screening for lipoid disorders: Secondary | ICD-10-CM | POA: Diagnosis not present

## 2022-07-30 ENCOUNTER — Ambulatory Visit: Payer: BC Managed Care – PPO | Admitting: Hematology and Oncology

## 2022-07-30 ENCOUNTER — Other Ambulatory Visit: Payer: BC Managed Care – PPO

## 2022-08-18 ENCOUNTER — Other Ambulatory Visit: Payer: Self-pay

## 2022-08-18 DIAGNOSIS — I81 Portal vein thrombosis: Secondary | ICD-10-CM

## 2022-08-19 ENCOUNTER — Inpatient Hospital Stay (HOSPITAL_BASED_OUTPATIENT_CLINIC_OR_DEPARTMENT_OTHER): Payer: BC Managed Care – PPO | Admitting: Hematology and Oncology

## 2022-08-19 ENCOUNTER — Inpatient Hospital Stay: Payer: BC Managed Care – PPO | Attending: Hematology and Oncology

## 2022-08-19 ENCOUNTER — Other Ambulatory Visit: Payer: Self-pay

## 2022-08-19 VITALS — BP 133/72 | HR 69 | Temp 97.5°F | Resp 14 | Wt 230.6 lb

## 2022-08-19 DIAGNOSIS — N2889 Other specified disorders of kidney and ureter: Secondary | ICD-10-CM | POA: Diagnosis not present

## 2022-08-19 DIAGNOSIS — R160 Hepatomegaly, not elsewhere classified: Secondary | ICD-10-CM | POA: Insufficient documentation

## 2022-08-19 DIAGNOSIS — I81 Portal vein thrombosis: Secondary | ICD-10-CM

## 2022-08-19 DIAGNOSIS — Z801 Family history of malignant neoplasm of trachea, bronchus and lung: Secondary | ICD-10-CM | POA: Diagnosis not present

## 2022-08-19 LAB — CMP (CANCER CENTER ONLY)
ALT: 21 U/L (ref 0–44)
AST: 12 U/L — ABNORMAL LOW (ref 15–41)
Albumin: 4.6 g/dL (ref 3.5–5.0)
Alkaline Phosphatase: 80 U/L (ref 38–126)
Anion gap: 4 — ABNORMAL LOW (ref 5–15)
BUN: 13 mg/dL (ref 6–20)
CO2: 29 mmol/L (ref 22–32)
Calcium: 9.5 mg/dL (ref 8.9–10.3)
Chloride: 107 mmol/L (ref 98–111)
Creatinine: 0.66 mg/dL (ref 0.61–1.24)
GFR, Estimated: >60 mL/min (ref >60)
Glucose, Bld: 112 mg/dL — ABNORMAL HIGH (ref 70–99)
Potassium: 3.7 mmol/L (ref 3.5–5.1)
Sodium: 140 mmol/L (ref 135–145)
Total Bilirubin: 0.4 mg/dL (ref 0.3–1.2)
Total Protein: 7.6 g/dL (ref 6.5–8.1)

## 2022-08-19 LAB — CBC WITH DIFFERENTIAL (CANCER CENTER ONLY)
Abs Immature Granulocytes: 0.02 10*3/uL (ref 0.00–0.07)
Basophils Absolute: 0.1 10*3/uL (ref 0.0–0.1)
Basophils Relative: 1 %
Eosinophils Absolute: 0.2 10*3/uL (ref 0.0–0.5)
Eosinophils Relative: 3 %
HCT: 38.5 % — ABNORMAL LOW (ref 39.0–52.0)
Hemoglobin: 12.7 g/dL — ABNORMAL LOW (ref 13.0–17.0)
Immature Granulocytes: 0 %
Lymphocytes Relative: 30 %
Lymphs Abs: 2.8 10*3/uL (ref 0.7–4.0)
MCH: 27.1 pg (ref 26.0–34.0)
MCHC: 33 g/dL (ref 30.0–36.0)
MCV: 82.3 fL (ref 80.0–100.0)
Monocytes Absolute: 0.8 10*3/uL (ref 0.1–1.0)
Monocytes Relative: 9 %
Neutro Abs: 5.6 10*3/uL (ref 1.7–7.7)
Neutrophils Relative %: 57 %
Platelet Count: 245 10*3/uL (ref 150–400)
RBC: 4.68 MIL/uL (ref 4.22–5.81)
RDW: 14.3 % (ref 11.5–15.5)
WBC Count: 9.5 10*3/uL (ref 4.0–10.5)
nRBC: 0 % (ref 0.0–0.2)

## 2022-08-19 NOTE — Progress Notes (Signed)
[facility] [phone]   [phone]  PROGRESS NOTE  Patient Care Team: Juan Pepper, MD as PCP - General (Family Medicine) Juan Margarita, MD as PCP - Cardiology (Cardiology) Juan Stain, MD as Pediatrician (Pediatrics)  Hematological/Oncological History # Portal Vein Thrombosis of Unclear Etiology 06/12/2021: patient presented to the ED with tachycardia and fever for 2-week period.  CT abdomen showed findings consistent with a perforated appendix.  Additionally there was noted to be a nonocclusive thrombus in the portal vein just beyond the portal mesenteric confluence.  There was notable hepatomegaly and hepatic steatosis.  CT angio study of the chest did not reveal any signs of pulmonary embolism 06/25/2021: establish care with Dr. Lorenso Valencia  Interval History:  Juan Valencia 21 y.o. male with medical history significant for a portal vein thrombosis of unclear etiology who presents for a follow up visit. The patient's last visit was on 01/15/2022. In the interim since the last visit he is continued on his Eliquis therapy without difficulty.  On exam today Mr. Juan Valencia is accompanied by his mother.  He reports he has been well in the interim since her last visit.  He reports that while at summer camp he took a spill where he tripped and fell on a ball and injured his ankle.  He was worried that he may have had a bleeding issue when he scraped his arms and leg but fortunately had no difficulty with bleeding.  He is continued on Eliquis therapy 5 mg twice daily without any difficulty.  He has not been having any major side effects as result of the treatment.  He currently denies any fevers, chills, sweats, nausea, vomiting or diarrhea.  Full 10 point ROS is listed below.  Follow-up in our discussion today focused on anticoagulation therapy moving forward.  The details of this conversation are noted below.   MEDICAL HISTORY:  Past Medical History:  Diagnosis Date    ADHD (attention deficit hyperactivity disorder)    Anemia    Anxiety    COVID-19    Diabetes mellitus without complication (HCC)    Gross motor development delay    Pneumonia    PONV (postoperative nausea and vomiting)    Portal vein thrombosis    SCFE (slipped capital femoral epiphysis)    Speech delay    Tachycardia, unspecified    Velopharyngeal insufficiency, congenital     SURGICAL HISTORY: Past Surgical History:  Procedure Laterality Date   APPENDECTOMY     HARDWARE REMOVAL Bilateral 07/26/2019   Procedure: removal of hardware bilateral hips;  Surgeon: Juan Pel, MD;  Location: [facility];  Service: Orthopedics;  Laterality: Bilateral;   HIP PINNING,CANNULATED Bilateral 05/26/2017   Procedure: BILATERAL CANNULATED HIP PINNING;  Surgeon: Juan Pel, MD;  Location: [facility];  Service: Orthopedics;  Laterality: Bilateral;   HIP SURGERY Bilateral    LAPAROSCOPIC APPENDECTOMY N/A 09/23/2021   Procedure: APPENDECTOMY LAPAROSCOPIC;  Surgeon: Juan Ok, MD;  Location: [facility];  Service: General;  Laterality: N/A;   PHARYNGEAL FLAP     PHARYNGEAL FLAP REVISION      SOCIAL HISTORY: Social History   Socioeconomic History   Marital status: Single    Spouse name: Not on file   Number of children: Not on file   Years of education: Not on file   Highest education level: Not on file  Occupational History   Not on file  Tobacco Use   Smoking status: Never   Smokeless tobacco: Never  Vaping Use   Vaping Use: Never used  Substance and Sexual Activity   Alcohol use: No   Drug use: No   Sexual activity: Not on file  Other Topics Concern   Not on file  Social History Narrative   Lives with parents, twin brother, younger brother. [employer].       Will attend [employer] in the fall, plans to major in business.    Social Determinants of Health   Financial Resource Strain: Not on file  Food Insecurity: Not on file  Transportation Needs: Not on  file  Physical Activity: Not on file  Stress: Not on file  Social Connections: Not on file  Intimate Partner Violence: Not on file    FAMILY HISTORY: Family History  Problem Relation Age of Onset   Obesity Mother    Diabetes Father    Tall stature Father    Hypertension Father    Obesity Maternal Grandmother    Hypertension Maternal Grandmother    Diabetes Maternal Grandmother    Heart disease Paternal Grandmother    Diabetes Paternal Grandmother    Lung cancer Paternal Grandfather    Stomach cancer Neg Hx    Colon cancer Neg Hx    Esophageal cancer Neg Hx    Pancreatic cancer Neg Hx     ALLERGIES:  is allergic to vancomycin, guaifenesin & derivatives, lomotil [diphenoxylate-atropine], chlorhexidine, dayquil [pseudoephedrine-apap-dm], and tylenol [acetaminophen].  MEDICATIONS:  Current Outpatient Medications  Medication Sig Dispense Refill   ferrous sulfate 325 (65 FE) MG EC tablet Take 325 mg by mouth daily with breakfast.     Accu-Chek Softclix Lancets lancets USE AS DIRECTED TO TEST GLUCOSE LEVELS ONCE DAILY. 100 each 3   apixaban (ELIQUIS) 5 MG TABS tablet Take 1 tablet (5 mg total) by mouth 2 (two) times daily. 60 tablet 2   glucose blood test strip Use to test blood glucose levels once daily. 100 each 3   metFORMIN (GLUCOPHAGE-XR) 500 MG 24 hr tablet Take 1 tablet (500 mg total) by mouth daily with breakfast. 90 tablet 3   No current facility-administered medications for this visit.    REVIEW OF SYSTEMS:   Constitutional: ( - ) fevers, ( - )  chills , ( - ) night sweats Eyes: ( - ) blurriness of vision, ( - ) double vision, ( - ) watery eyes Ears, nose, mouth, throat, and face: ( - ) mucositis, ( - ) sore throat Respiratory: ( - ) cough, ( - ) dyspnea, ( - ) wheezes Cardiovascular: ( - ) palpitation, ( - ) chest discomfort, ( - ) lower extremity swelling Gastrointestinal:  ( - ) nausea, ( - ) heartburn, ( - ) change in bowel habits Skin: ( - ) abnormal skin  rashes Lymphatics: ( - ) new lymphadenopathy, ( - ) easy bruising Neurological: ( - ) numbness, ( - ) tingling, ( - ) new weaknesses Behavioral/Psych: ( - ) mood change, ( - ) new changes  All other systems were reviewed with the patient and are negative.  PHYSICAL EXAMINATION: ECOG PERFORMANCE STATUS: 0 - Asymptomatic  Vitals:   08/19/22 1435  BP: 133/72  Pulse: 69  Resp: 14  Temp: (!) 97.5 F (36.4 C)  SpO2: 97%    Filed Weights   08/19/22 1435  Weight: 230 lb 9.6 oz (104.6 kg)     GENERAL: Well-appearing young Caucasian male, alert, no distress and comfortable SKIN: skin color, texture, turgor are normal, no rashes or significant lesions  EYES: conjunctiva are pink and non-injected, sclera clear LUNGS: clear to auscultation and percussion with normal breathing effort HEART: regular rate & rhythm and no murmurs and no lower extremity edema Musculoskeletal: no cyanosis of digits and no clubbing  PSYCH: alert & oriented x 3, fluent speech NEURO: no focal motor/sensory deficits  LABORATORY DATA:  I have reviewed the data as listed    Latest Ref Rng & Units 08/19/2022    1:54 PM 04/16/2022    3:43 PM 01/15/2022    2:32 PM  CBC  WBC 4.0 - 10.5 K/uL 9.5  10.2  9.5   Hemoglobin 13.0 - 17.0 g/dL 12.7  12.6  12.2   Hematocrit 39.0 - 52.0 % 38.5  39.6  38.3   Platelets 150 - 400 K/uL 245  262  247        Latest Ref Rng & Units 08/19/2022    1:54 PM 04/16/2022    3:43 PM 01/15/2022    2:32 PM  CMP  Glucose 70 - 99 mg/dL 112  102  86   BUN 6 - 20 mg/dL '13  13  15   '$ Creatinine 0.61 - 1.24 mg/dL 0.66  0.66  0.60   Sodium 135 - 145 mmol/L 140  141  141   Potassium 3.5 - 5.1 mmol/L 3.7  4.0  3.9   Chloride 98 - 111 mmol/L 107  108  107   CO2 22 - 32 mmol/L '29  27  27   '$ Calcium 8.9 - 10.3 mg/dL 9.5  9.1  9.4   Total Protein 6.5 - 8.1 g/dL 7.6  7.5  7.5   Total Bilirubin 0.3 - 1.2 mg/dL 0.4  0.3  0.3   Alkaline Phos 38 - 126 U/L 80  88  94   AST 15 - 41 U/L '12  14  16   '$ ALT 0 -  44 U/L 21  28  32     RADIOGRAPHIC STUDIES: No results found.  ASSESSMENT & PLAN JOVANY DISANO 21 y.o. male with medical history significant for a portal vein thrombosis of unclear etiology who presents for a follow up visit.   After review of the labs, review of the records, and discussion with the patient the patients findings are most consistent with a portal vein thrombus of unclear etiology.  The patient does have a perforated appendicitis which could potentially cause local inflammation, but these are not generally associated with portal vein thrombi.  Additionally he has hepatomegaly and hepatic steatosis of remarkable degree for a person of this age.  These constitute unusual findings and as such I recommended that we proceed with a hypercoagulable work-up to include JAK2 and PNH testing. This testing was negative.  Today we discussed options moving forward.  He is not having any further evaluation for his liver.  Given this I do believe that we should not have to continue anticoagulation indefinitely.  His perforated appendix was the likely etiology of his blood clot.  I discussed with him that we cannot say with certainty that was the cause of the portal vein thrombus and therefore he may be at risk for recurrent VTE.  He noted he would like to discontinue anticoagulation therapy.  I noted that if he was anxious about future VTE could consider aspirin therapy.  Additionally I would recommend aspirin therapy prior to prolonged car travel or long flights.  Overall I do believe would be reasonable to discontinue anticoagulation at this time. He voiced understanding of the  plan moving forward.  # Portal Vein Thrombosis #Hepatomegaly/Hepatic Steatosis --negative hypercoagulable workup including FVL, prothrombin gene mutation, Antithrombin III, and antiphospholipid antibodies. --negative JAK2 and PNH testing -- Patient has undergone GI evaluation for this hepatomegaly and hepatic steatosis. On  11/13/2021 had a visit with a [facility].  -- Have detailed discussion with Mr. Alfrey regarding anticoagulation therapy moving forward.  He notes he would like to discontinue anticoagulation.  The details of this conversation are noted above --At this time I think it would be reasonable to complete his existing prescription of Eliquis 5 mg twice daily and then once it is done he does not require any further anticoagulation therapy. --RTC on an as needed basis.   No orders of the defined types were placed in this encounter.  All questions were answered. The patient knows to call the clinic with any problems, questions or concerns.  A total of more than 30 minutes were spent on this encounter with face-to-face time and non-face-to-face time, including preparing to see the patient, ordering tests and/or medications, counseling the patient and coordination of care as outlined above.   Ledell Peoples, MD Department of Hematology/Oncology [facility] Phone: [phone] Pager: [phone] Email: [email or web address]  08/19/2022 5:18 PM

## 2022-10-07 ENCOUNTER — Other Ambulatory Visit: Payer: Self-pay

## 2022-10-07 MED ORDER — METFORMIN HCL ER 500 MG PO TB24: 500.0000 mg | ORAL_TABLET | Freq: Every day | ORAL | 0 refills | Status: AC

## 2022-11-01 DIAGNOSIS — E119 Type 2 diabetes mellitus without complications: Secondary | ICD-10-CM | POA: Diagnosis not present

## 2022-11-01 DIAGNOSIS — M93004 Unspecified slipped upper femoral epiphysis (nontraumatic), bilateral hips: Secondary | ICD-10-CM | POA: Diagnosis not present

## 2022-11-01 DIAGNOSIS — I81 Portal vein thrombosis: Secondary | ICD-10-CM | POA: Diagnosis not present

## 2022-11-01 DIAGNOSIS — D509 Iron deficiency anemia, unspecified: Secondary | ICD-10-CM | POA: Diagnosis not present

## 2022-11-09 DIAGNOSIS — E119 Type 2 diabetes mellitus without complications: Secondary | ICD-10-CM | POA: Diagnosis not present

## 2022-11-11 ENCOUNTER — Other Ambulatory Visit (HOSPITAL_COMMUNITY): Payer: Self-pay

## 2022-11-11 MED ORDER — FREESTYLE LIBRE 3 SENSOR MISC
1 refills | Status: AC
Start: 2022-11-11 — End: ?
  Filled 2022-11-11: qty 2, 28d supply, fill #0

## 2022-11-15 ENCOUNTER — Other Ambulatory Visit (HOSPITAL_COMMUNITY): Payer: Self-pay

## 2022-11-16 ENCOUNTER — Other Ambulatory Visit (HOSPITAL_COMMUNITY): Payer: Self-pay

## 2023-01-03 ENCOUNTER — Other Ambulatory Visit: Payer: Self-pay | Admitting: Internal Medicine

## 2023-02-25 IMAGING — US US HEPATIC LIVER DOPPLER
2 series · 15 of 25 positions shown · non-contrast
Comparison: 06/13/2021

CLINICAL DATA: History of portal vein thrombus

EXAM:
DUPLEX ULTRASOUND OF LIVER
TECHNIQUE: Color and duplex Doppler ultrasound was performed to evaluate the
hepatic in-flow and out-flow vessels. Technologist describes
technically difficult study secondary to body habitus.

[Series 1: us art/ven flow abd pelv doppl mc & wl · 13 of 34 slices shown (1 of 2)]
[im 1/34]
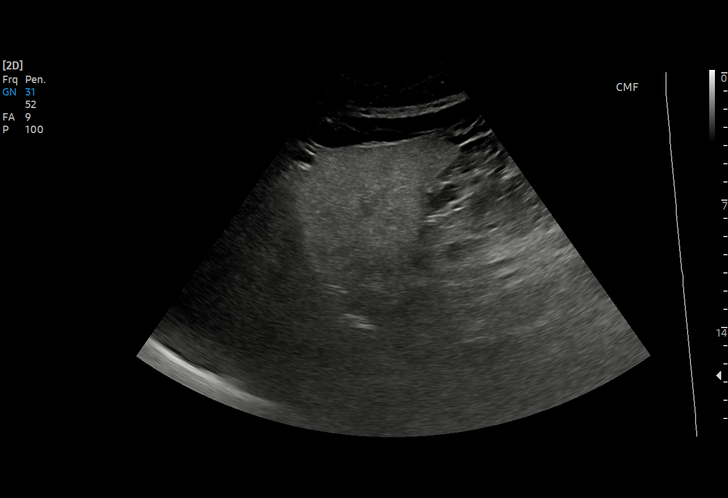
[im 4/34]
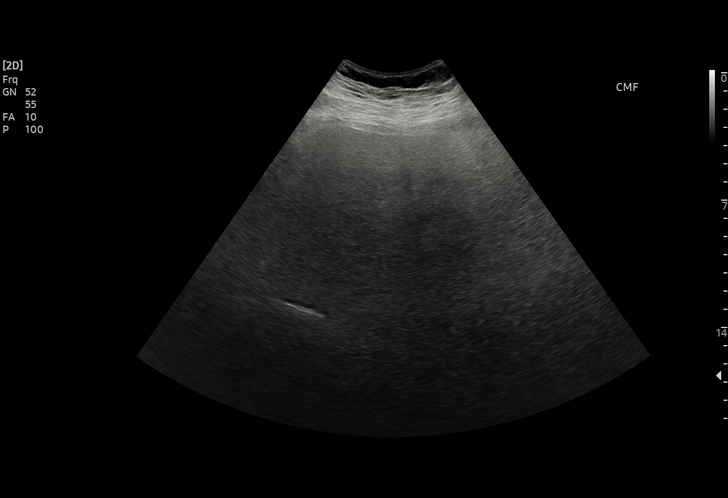
[im 7/34]
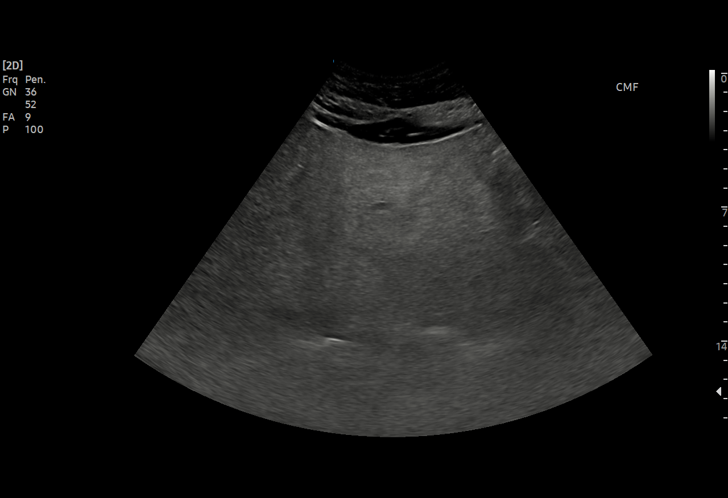
[im 8/34]
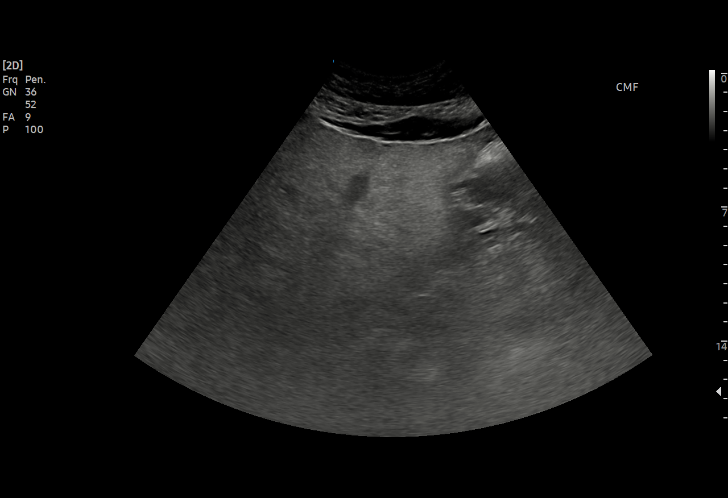
[im 12/34]
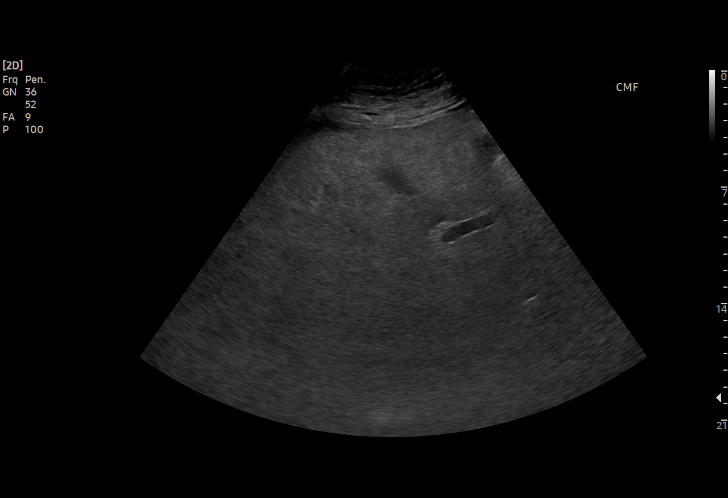
[im 15/34]
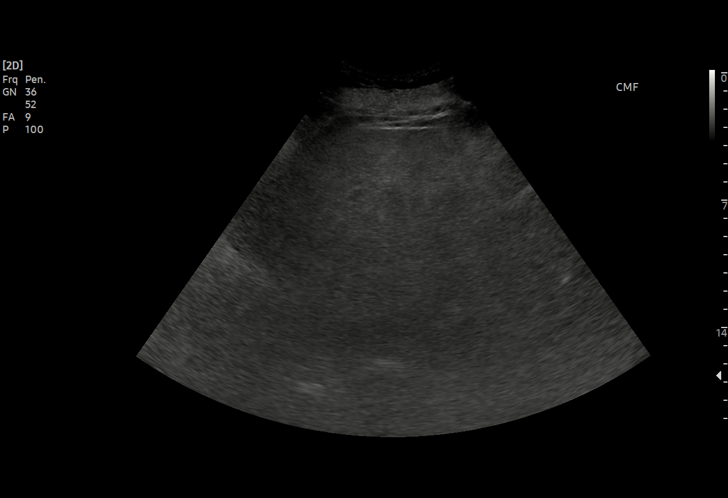
[im 16/34]
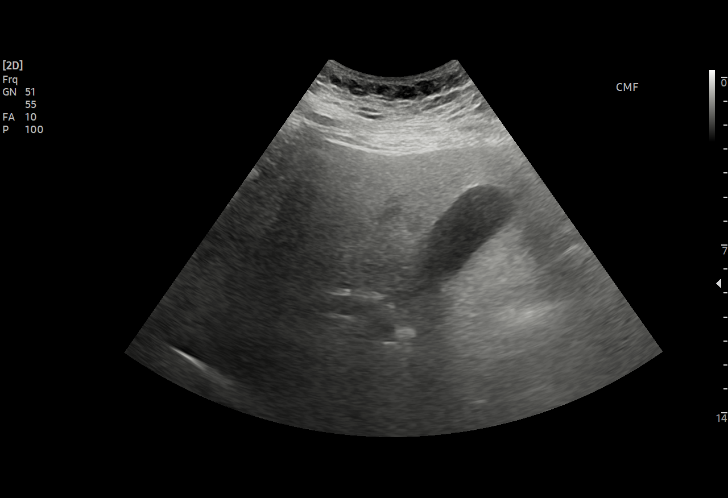
[im 19/34]
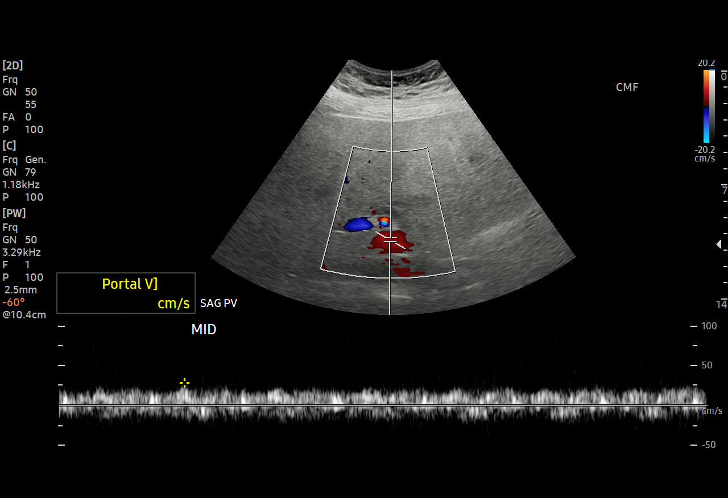
[im 23/34]
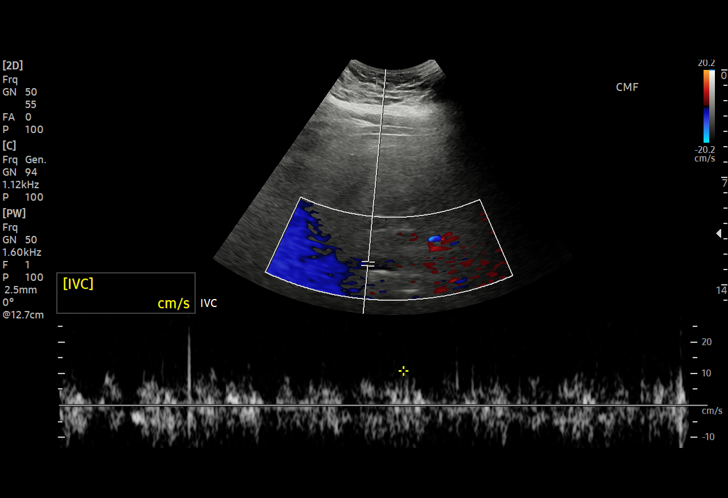
[im 24/34]
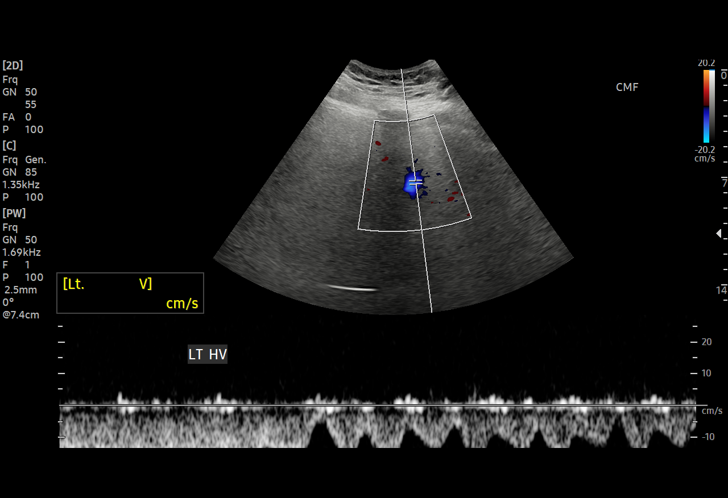
[im 27/34]
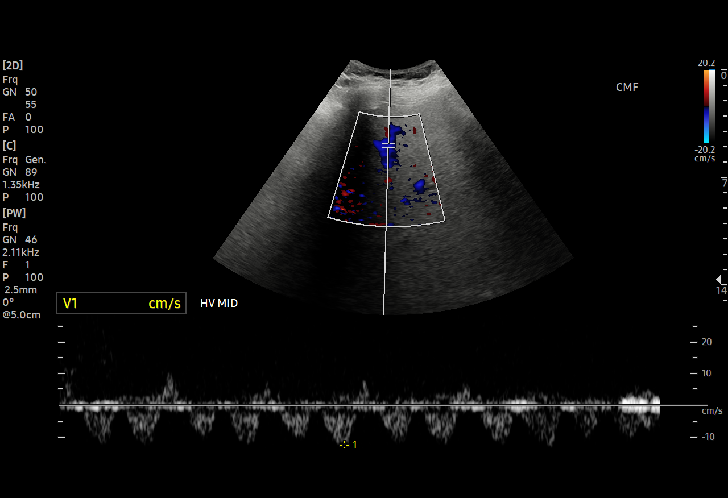
[im 30/34]
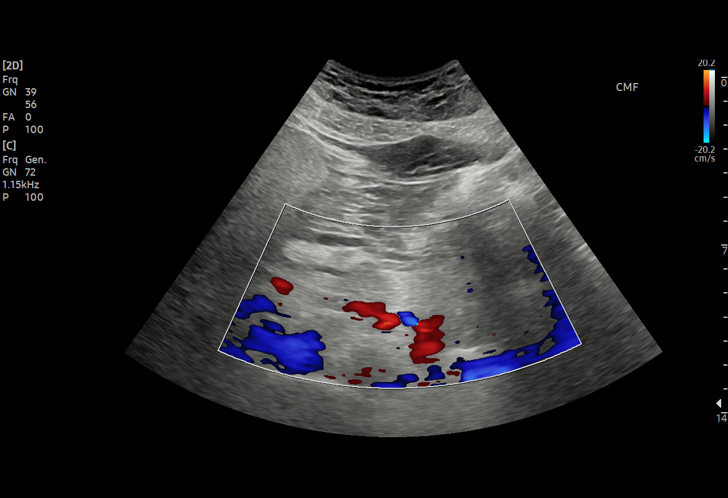
[im 32/34]
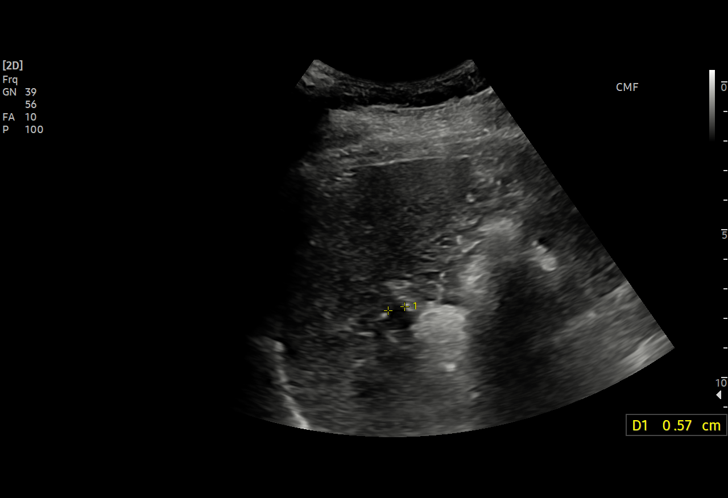

[Series 3: us art/ven flow abd pelv doppl mc & wl · 2 of 5 slices shown (2 of 2)]
[im 1/5]
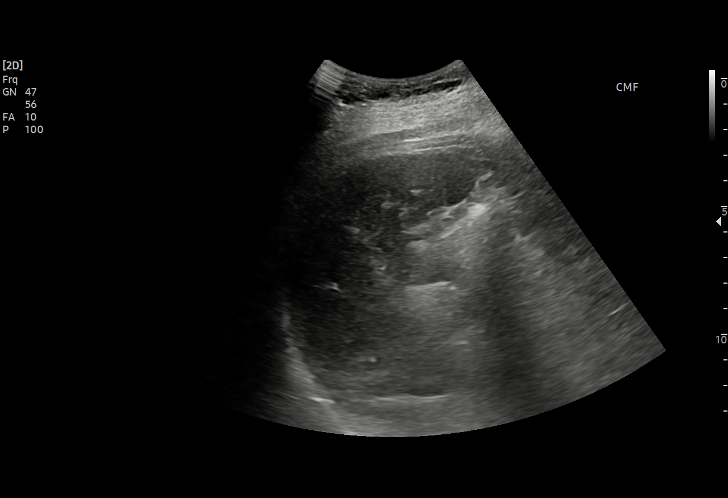
[im 5/5]
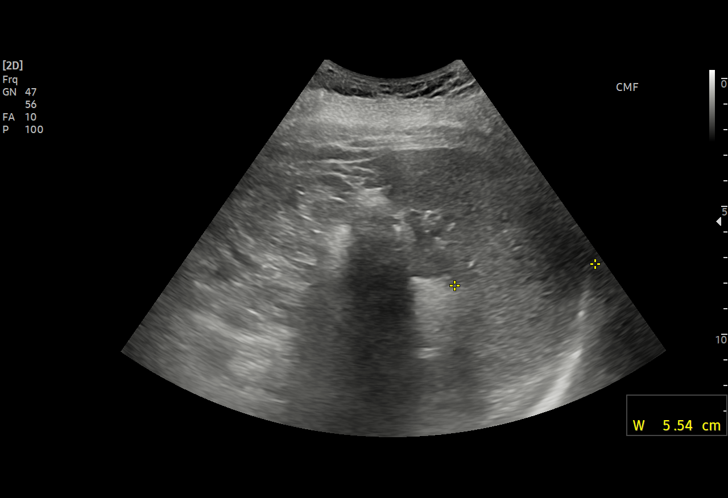

[15 of 25 positions shown; findings below may reference images not displayed]

FINDINGS: Liver: Coarse parenchymal echogenicity, somewhat difficult to
penetrate. Normal hepatic contour without nodularity.

No focal lesion, mass or intrahepatic biliary ductal dilatation.

Main Portal Vein size: 0.9 cm

Portal Vein Velocities (all hepatopetal):

Main Prox:  33 cm/sec

Main Mid: 28 cm/sec

Main Dist:  28 cm/sec
Right: 24 cm/sec
Left: 23 cm/sec

Hepatic Vein Velocities (all hepatofugal):

Right:  19 cm/sec

Middle:  12 cm/sec

Left:  20 cm/sec

IVC: Present and patent with normal respiratory phasicity.

Hepatic Artery Velocity:  51 cm/sec

Splenic Vein Velocity:  7.9 cm/sec

Spleen: 8 cm x 3.8 cm x 55 cm with a total volume of 87 cm^3 (411
cm^3 is upper limit normal)

Portal Vein Occlusion/Thrombus: None seen

Splenic Vein Occlusion/Thrombus: None seen

Ascites: Absent

Varices: None identified
IMPRESSION: 1. Unremarkable hepatic vascular Doppler evaluation. No evidence of
portal vein thrombus.

## 2023-03-24 ENCOUNTER — Other Ambulatory Visit (HOSPITAL_COMMUNITY): Payer: Self-pay

## 2023-05-02 DIAGNOSIS — R7309 Other abnormal glucose: Secondary | ICD-10-CM | POA: Diagnosis not present

## 2023-05-02 DIAGNOSIS — D649 Anemia, unspecified: Secondary | ICD-10-CM | POA: Diagnosis not present

## 2023-06-17 ENCOUNTER — Encounter (INDEPENDENT_AMBULATORY_CARE_PROVIDER_SITE_OTHER): Payer: Self-pay

## 2023-06-29 DIAGNOSIS — L600 Ingrowing nail: Secondary | ICD-10-CM | POA: Diagnosis not present

## 2023-07-22 ENCOUNTER — Other Ambulatory Visit (HOSPITAL_COMMUNITY): Payer: Self-pay

## 2024-06-13 ENCOUNTER — Other Ambulatory Visit (INDEPENDENT_AMBULATORY_CARE_PROVIDER_SITE_OTHER): Payer: Self-pay

## 2024-06-13 ENCOUNTER — Encounter: Payer: Self-pay | Admitting: Pharmacist

## 2024-06-13 ENCOUNTER — Other Ambulatory Visit: Payer: Self-pay

## 2024-06-13 ENCOUNTER — Other Ambulatory Visit (HOSPITAL_COMMUNITY): Payer: Self-pay

## 2024-06-13 ENCOUNTER — Ambulatory Visit (INDEPENDENT_AMBULATORY_CARE_PROVIDER_SITE_OTHER): Admitting: Orthopaedic Surgery

## 2024-06-13 DIAGNOSIS — M25552 Pain in left hip: Secondary | ICD-10-CM

## 2024-06-13 DIAGNOSIS — M25551 Pain in right hip: Secondary | ICD-10-CM | POA: Diagnosis not present

## 2024-06-13 MED ORDER — CELECOXIB 200 MG PO CAPS
200.0000 mg | ORAL_CAPSULE | Freq: Two times a day (BID) | ORAL | 1 refills | Status: DC | PRN
Start: 2024-06-13 — End: 2024-07-25
  Filled 2024-06-13: qty 60, 30d supply, fill #0

## 2024-06-13 MED ORDER — METHOCARBAMOL 500 MG PO TABS
500.0000 mg | ORAL_TABLET | Freq: Four times a day (QID) | ORAL | 1 refills | Status: AC | PRN
  Filled 2024-06-13: qty 40, 10d supply, fill #0

## 2024-06-13 NOTE — Progress Notes (Signed)
 The patient is a 23 year old gentleman has a remote history of bilateral hip slipped capital femoral epiphysis.  This is when he was in his childhood.  Eventually my partner Dr. Addie remove the screws from both hips.  Recently he has been developing pain over both hips on the lateral aspect of his hips and stiffness.  He has been a diabetic in the past but that has gotten much better.  He reports pain with activities but not truly groin pain.  He denies any specific injuries.  On exam both hips move smoothly and fluidly with no blocks or rotation and no pain in the groin.  He is very stiff of the IT band and trochanteric area of both hips and that is where a lot of his pain seems to be.  AP pelvis and lateral both hips shows well hardware is been removed from both hips previously.  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  He would definitely benefit from Celebrex as an anti-inflammatory and Robaxin as a muscle relaxant but I would also like to send him to outpatient physical therapy to see if they can help decrease his stiffness with both hips in terms of the lateral aspect of the hips and any modalities that can help decrease some of the pain in these areas.  He agreed to this treatment plan.  Will see him back in 6 weeks to see how he is doing overall.  If he continues to have hip issues we would likely obtain an MRI of the pelvis to assess both hips.

## 2024-07-04 ENCOUNTER — Ambulatory Visit (INDEPENDENT_AMBULATORY_CARE_PROVIDER_SITE_OTHER): Admitting: Rehabilitative and Restorative Service Providers"

## 2024-07-04 ENCOUNTER — Encounter: Payer: Self-pay | Admitting: Rehabilitative and Restorative Service Providers"

## 2024-07-04 DIAGNOSIS — R293 Abnormal posture: Secondary | ICD-10-CM

## 2024-07-04 DIAGNOSIS — M25552 Pain in left hip: Secondary | ICD-10-CM

## 2024-07-04 DIAGNOSIS — M25551 Pain in right hip: Secondary | ICD-10-CM | POA: Diagnosis not present

## 2024-07-04 DIAGNOSIS — R2689 Other abnormalities of gait and mobility: Secondary | ICD-10-CM | POA: Diagnosis not present

## 2024-07-04 DIAGNOSIS — M6281 Muscle weakness (generalized): Secondary | ICD-10-CM | POA: Diagnosis not present

## 2024-07-04 NOTE — Therapy (Addendum)
 OUTPATIENT PHYSICAL THERAPY EVALUATION   Patient Name: Juan Valencia MRN: [id number] DOB:03/31/2001, 23 y.o., male Today's Date: 07/04/2024  END OF SESSION:  PT End of Session - 07/04/24 1300     Visit Number 1    Number of Visits 10    Date for PT Re-Evaluation 09/12/24    Authorization Type [employer]    PT Start Time 1300    PT Stop Time 1352    PT Time Calculation (min) 52 min    Activity Tolerance Patient tolerated treatment well    Behavior During Therapy WFL for tasks assessed/performed          Past Medical History:  Diagnosis Date   ADHD (attention deficit hyperactivity disorder)    Anemia    Anxiety    COVID-19    Diabetes mellitus without complication (HCC)    Gross motor development delay    Pneumonia    PONV (postoperative nausea and vomiting)    Portal vein thrombosis    SCFE (slipped capital femoral epiphysis)    Speech delay    Tachycardia, unspecified    Velopharyngeal insufficiency, congenital    Past Surgical History:  Procedure Laterality Date   APPENDECTOMY     HARDWARE REMOVAL Bilateral 07/26/2019   Procedure: removal of hardware bilateral hips;  Surgeon: Addie Cordella Hamilton, MD;  Location: [facility] OR;  Service: Orthopedics;  Laterality: Bilateral;   HIP PINNING,CANNULATED Bilateral 05/26/2017   Procedure: BILATERAL CANNULATED HIP PINNING;  Surgeon: Addie Hamilton Cordella, MD;  Location: [facility] OR;  Service: Orthopedics;  Laterality: Bilateral;   HIP SURGERY Bilateral    LAPAROSCOPIC APPENDECTOMY N/A 09/23/2021   Procedure: APPENDECTOMY LAPAROSCOPIC;  Surgeon: Rubin Calamity, MD;  Location: [facility] ORS;  Service: General;  Laterality: N/A;   PHARYNGEAL FLAP     PHARYNGEAL FLAP REVISION     Patient Active Problem List   Diagnosis Date Noted   Diabetes (HCC) 08/01/2021   Anemia 06/14/2021   Hepatic steatosis 06/14/2021   Perforated appendicitis 06/12/2021   Thrombosis, portal vein 06/12/2021   Hypokalemia    COVID-19 07/26/2020   SCFE (slipped capital  femoral epiphysis), right    SCFE (slipped capital femoral epiphysis), left    SCFE (slipped capital femoral epiphysis) 05/26/2017   Growth hormone deficiency (HCC) 03/31/2017   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   Obesity peds (BMI >=95 percentile) 04/10/2015   Delayed bone age 42/14/2015   Short stature 05/02/2012   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 male whom presents with bilateral hip pain with a history of bilateral slipped capital femoral epiphysis.  Patient states he has pain and discomfort since his surgery back in 2022 when surgeons took the hardware out of his hips.  He has pain with various motions such as getting out of a chair or bed, standing for long periods, and stairs.  A couple months ago patient started going to the gym but stopped once he realized it caused his pain to  worsen.  He was doing exercises such as the bike, UE weights, and modified squats. His reason for coming to PT is to discover his limits and create a maintenance program so that he is able to get back to the activities he wants and live w/o pain.   PERTINENT HISTORY:  bilateral slipped capital femoral epiphysis, DM  PAIN:  NPRS scale: 0/10 at rest, 5-7/10 with long periods of standing, 7/10 when initially standing Pain location: anterior/medial hip in both legs, worse on R leg, lateral legs in both legs  Pain description: achy,  stabbing when standing  Aggravating factors: standing for long periods of times, staying in position for too long  Relieving factors: pain medication  PRECAUTIONS: None  WEIGHT BEARING RESTRICTIONS: No  FALLS:  Has patient fallen in last 6 months? No  LIVING ENVIRONMENT: Lives with: lives with their family Lives in: House/apartment Stairs: Yes: Internal: 2 floors steps; on left going up and External: 3 steps; none Has following equipment at home: None  OCCUPATION: home, applying to jobs  PLOF: Independent  PATIENT GOALS: manage pain, understand limitations, maintance program, standing and walking for long periods of time  Next MD visit: Office visit, 07/25/2024   OBJECTIVE:   DIAGNOSTIC FINDINGS:   XR HIPS BILAT W OR W/O PELVIS 2V An AP pelvis and lateral both hips shows a history of previous screw removal from both hips.  The joint space shows only slight narrowing and a slight osteophyte off the superior lateral femoral head and neck on both hips that is small.    PATIENT SURVEYS:  Patient-Specific Activity Scoring Scheme  0 represents "unable to perform." 10 represents "able to perform at prior level. 0 1 2 3 4 5 6 7 8 9  10 (Date and Score)   Activity Eval  07/04/2024    1. standing (more than 2 hours)  3    2. getting out chairs/bed 6     3. stairs  0   4.    5.    Score 3    Total score = sum of the activity scores/number of activities Minimum detectable change (90%CI) for average score = 2 points Minimum detectable change (90%CI) for single activity score = 3 points  COGNITION: 07/04/2024 Overall cognitive status: WFL    SENSATION: 07/04/2024 WFL  EDEMA:  07/04/2024 not tested  MUSCLE LENGTH:  07/04/2024 not tested  POSTURE:  07/04/2024 rounded shoulders, forward head, stands with both LEs in ER, and wide BOS  PALPATION: not tested  LOWER EXTREMITY ROM:   ROM Right Eval 07/04/2024  Left Eval 07/04/2024  Hip flexion    Hip extension     Hip abduction    Hip adduction    Hip internal rotation 16 AROM in seated 15 AROM in seated   Hip external rotation 28 AROM in seated   PROM in supine: 65* 42 AROM in seated  PROM in supine: 55*  Knee flexion    Knee extension    Ankle dorsiflexion    Ankle plantarflexion    Ankle inversion    Ankle eversion     (Blank rows = not tested)  LOWER EXTREMITY MMT:  MMT Right Eval 07/04/2024 Left Eval 07/04/2024  Hip flexion 4+/5 5/5  Hip extension 4-/5 4-/5  Hip abduction 5/5 5/5  Hip adduction    Hip internal rotation    Hip external rotation    Knee flexion 5/5 5/5  Knee extension 5/5 5/5  Ankle dorsiflexion 5/5 5/5  Ankle plantarflexion  Ankle inversion    Ankle eversion     (Blank rows = not tested)  LOWER EXTREMITY SPECIAL TESTS:  07/04/2024 not tested   FUNCTIONAL TESTS:  07/04/2024 18 inch chair transfer: needs some UE support, stands with both legs in ER, with wide BOS  GAIT: 07/04/2024 Distance walked: 50', independent  Comments: wide BOS, LEs in ER, short step length                                                                                                                                                                        TODAY'S TREATMENT                                                                          DATE: 07/04/2024 Therex: HEP instruction/performance c cues for techniques, handout provided.  Trial set performed of each for comprehension and symptom assessment.  See below for exercise list  Self-Care: PT educated patient on ways to minimize discomfort during long periods of standing and walking. Patient verbalized understanding.  Pain monitoring scale given for self assessment/management.   PATIENT EDUCATION:  Education details: HEP, POC Person educated: Patient Education method: Programmer, multimedia, Demonstration, Verbal cues, and Handouts Education comprehension: verbalized understanding, returned demonstration, and verbal cues  required  HOME EXERCISE PROGRAM: Access Code: [email or web address] URL: [email or web address] Date: 07/04/2024 Prepared by: Ozell Silvan   Exercises - Supine Bridge with Resistance Band  - 1-2 x daily - 7 x weekly - 1-2 sets - 10-15 reps - 2 hold - Clamshell with Resistance  - 1-2 x daily - 7 x weekly - 2-3 sets - 10-15 reps - Standing Hip Hiking  - 1-2 x daily - 7 x weekly - 1 sets - 10 reps - 5 hold - Seated SLR  - 1-2 x daily - 7 x weekly - 1-2 sets - 10-15 reps - 2 hold  ASSESSMENT:  CLINICAL IMPRESSION: Patient is a 23 y.o. who comes to clinic with complaints of bilateral hip pain with mobility, strength and movement coordination deficits that impair their ability to perform usual daily and recreational functional activities without increase difficulty/symptoms at this time.  Patient to benefit from skilled PT services to address impairments and limitations to improve to previous level of function without restriction secondary to condition.   OBJECTIVE IMPAIRMENTS: Abnormal gait, decreased activity tolerance, decreased endurance, decreased mobility, difficulty walking, decreased strength, improper body mechanics, postural dysfunction, and pain.   ACTIVITY LIMITATIONS: lifting, bending, sitting, standing,  squatting, stairs, bathing, and locomotion level  PARTICIPATION LIMITATIONS: cleaning, laundry, community activity, and yard work  PERSONAL FACTORS: Behavior pattern, Fitness, and Time since onset of injury/illness/exacerbation are also affecting patient's functional outcome.   REHAB POTENTIAL: Good  CLINICAL DECISION MAKING: Stable/uncomplicated  EVALUATION COMPLEXITY: Low   GOALS: Goals reviewed with patient? Yes  SHORT TERM GOALS: (target date for Short term goals: 07/25/2024)   1.  Patient will demonstrate independent use of home exercise program to maintain progress from in clinic treatments.  Goal status: New  LONG TERM GOALS: (target dates for all long term  goals: 09/12/2024 )   1. Patient will demonstrate/report pain at worst less than or equal to 2/10 to facilitate minimal limitation in daily activity secondary to pain symptoms.  Goal status: New   2. Patient will demonstrate independent use of home exercise program to facilitate ability to maintain/progress functional gains from skilled physical therapy services.  Goal status: New   3. Patient will demonstrate Patient specific functional scale avg > or = 8 to indicate reduced disability due to condition.   Goal status: New   4.  Patient will demonstrate bilateral Hip MMT 5/5 throughout to faciltiate usual transfers, stairs, squatting at [facility] for daily life.   Goal status: New  5. Patient will demonstrate ascending/descending stairs reciprocally s UE assist for community integration.   Goal Status : new  6. Patient will demonstrate/report ability to perform workout routine at gym for exercise benefits.   PLAN:  PT FREQUENCY: 1-2x/week  PT DURATION: 10 weeks  PLANNED INTERVENTIONS: Can include 02853- PT Re-evaluation, 97110-Therapeutic exercises, 97530- Therapeutic activity, [id number]- Neuromuscular re-education, 97535- Self Care, 97140- Manual therapy, [phone]- Gait training, [phone]- Orthotic Fit/training, [phone]- Canalith repositioning, [id number]- Aquatic Therapy, [phone]- Electrical stimulation (unattended), [id number] Physical performance testing, 97016- Vasopneumatic device, [id number]- Ultrasound, [id number]- Traction (mechanical), [id number]- Ionotophoresis 4mg /ml Dexamethasone ,  [id number] - Needle insertion w/o injection 1 or 2 muscles, 20561 - Needle insertion w/o injection 3 or more muscles.   Patient/Family education, Balance training, Stair training, Taping, Dry Needling, Joint mobilization, Joint manipulation, Spinal manipulation, Spinal mobilization, Scar mobilization, Vestibular training, Visual/preceptual remediation/compensation, DME instructions, Cryotherapy, and Moist heat.  All performed as medically  necessary.  All included unless contraindicated  PLAN FOR NEXT SESSION: Review HEP knowledge/results. Create strengthening exercises for hip musculature and focus on correcting ER   Ismael Nap, Student-PT 07/04/2024, 3:14 PM

## 2024-07-10 ENCOUNTER — Encounter

## 2024-07-20 ENCOUNTER — Ambulatory Visit: Admitting: Physical Therapy

## 2024-07-20 ENCOUNTER — Encounter: Payer: Self-pay | Admitting: Physical Therapy

## 2024-07-20 DIAGNOSIS — M6281 Muscle weakness (generalized): Secondary | ICD-10-CM

## 2024-07-20 DIAGNOSIS — R293 Abnormal posture: Secondary | ICD-10-CM

## 2024-07-20 DIAGNOSIS — M25552 Pain in left hip: Secondary | ICD-10-CM

## 2024-07-20 DIAGNOSIS — R2689 Other abnormalities of gait and mobility: Secondary | ICD-10-CM | POA: Diagnosis not present

## 2024-07-20 DIAGNOSIS — M25551 Pain in right hip: Secondary | ICD-10-CM

## 2024-07-20 NOTE — Therapy (Signed)
 OUTPATIENT PHYSICAL THERAPY TREATMENT    Patient Name: Juan Valencia MRN: [id number] DOB:Dec 09, 2001, 23 y.o., male Today's Date: 07/20/2024  END OF SESSION:  PT End of Session - 07/20/24 1103     Visit Number 2    Number of Visits 10    Date for PT Re-Evaluation 09/12/24    Authorization Type BC[employer]    PT Start Time 1017    PT Stop Time 1057    PT Time Calculation (min) 40 min    Activity Tolerance Patient tolerated treatment well    Behavior During Therapy WFL for tasks assessed/performed           Past Medical History:  Diagnosis Date   ADHD (attention deficit hyperactivity disorder)    Anemia    Anxiety    COVID-19    Diabetes mellitus without complication (HCC)    Gross motor development delay    Pneumonia    PONV (postoperative nausea and vomiting)    Portal vein thrombosis    SCFE (slipped capital femoral epiphysis)    Speech delay    Tachycardia, unspecified    Velopharyngeal insufficiency, congenital    Past Surgical History:  Procedure Laterality Date   APPENDECTOMY     HARDWARE REMOVAL Bilateral 07/26/2019   Procedure: removal of hardware bilateral hips;  Surgeon: Addie Cordella Hamilton, MD;  Location: [facility] OR;  Service: Orthopedics;  Laterality: Bilateral;   HIP PINNING,CANNULATED Bilateral 05/26/2017   Procedure: BILATERAL CANNULATED HIP PINNING;  Surgeon: Addie Hamilton Cordella, MD;  Location: [facility] OR;  Service: Orthopedics;  Laterality: Bilateral;   HIP SURGERY Bilateral    LAPAROSCOPIC APPENDECTOMY N/A 09/23/2021   Procedure: APPENDECTOMY LAPAROSCOPIC;  Surgeon: Rubin Calamity, MD;  Location: [facility] ORS;  Service: General;  Laterality: N/A;   PHARYNGEAL FLAP     PHARYNGEAL FLAP REVISION     Patient Active Problem List   Diagnosis Date Noted   Diabetes (HCC) 08/01/2021   Anemia 06/14/2021   Hepatic steatosis 06/14/2021   Perforated appendicitis 06/12/2021   Thrombosis, portal vein 06/12/2021   Hypokalemia    COVID-19 07/26/2020   SCFE (slipped capital  femoral epiphysis), right    SCFE (slipped capital femoral epiphysis), left    SCFE (slipped capital femoral epiphysis) 05/26/2017   Growth hormone deficiency (HCC) 03/31/2017   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   Obesity peds (BMI >=95 percentile) 04/10/2015   Delayed bone age 48/14/2015   Short stature 05/02/2012   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   Velopharyngeal insufficiency, congenital    ADHD (attention deficit hyperactivity disorder)    Speech delay    Gross motor development delay     PCP: Kip Righter, MD  REFERRING PROVIDER: Vernetta Lonni GRADE*  REFERRING DIAG: [phone] (ICD-10-CM) - Bilateral hip pain   THERAPY DIAG:  Pain of both hip joints  Muscle weakness (generalized)  Other abnormalities of gait and mobility  Abnormal posture  Rationale for Evaluation and Treatment: Rehabilitation  ONSET DATE: referral date: 06/13/2024,   SUBJECTIVE:   SUBJECTIVE STATEMENT:    Feeling good today, things are feeling good with the exercises. Was on medication for the hips but stopped to see how I feel without it, doing good     EVAL: Patient is a 23 y.o. male whom presents with bilateral hip pain with a history of bilateral slipped capital femoral epiphysis.  Patient states he has pain and discomfort since his surgery back in 2022 when surgeons took the hardware out of his hips.  He has pain with various motions  such as getting out of a chair or bed, standing for long periods, and stairs.  A couple months ago patient started going to the gym but stopped once he realized it caused his pain to worsen.  He was doing exercises such as the bike, UE weights, and modified squats. His reason for coming to PT is to discover his limits and create a maintenance program so that he is able to get back to the activities he wants and live w/o pain.   PERTINENT HISTORY:  bilateral slipped capital femoral epiphysis, DM  PAIN:  NPRS scale: 0/10  Pain location:  Pain  description:  Aggravating factors:  Relieving factors:   PRECAUTIONS: None  WEIGHT BEARING RESTRICTIONS: No  FALLS:  Has patient fallen in last 6 months? No  LIVING ENVIRONMENT: Lives with: lives with their family Lives in: House/apartment Stairs: Yes: Internal: 2 floors steps; on left going up and External: 3 steps; none Has following equipment at home: None  OCCUPATION: home, applying to jobs  PLOF: Independent  PATIENT GOALS: manage pain, understand limitations, maintance program, standing and walking for long periods of time  Next MD visit: Office visit, 07/25/2024   OBJECTIVE:   DIAGNOSTIC FINDINGS:   XR HIPS BILAT W OR W/O PELVIS 2V An AP pelvis and lateral both hips shows a history of previous screw removal from both hips.  The joint space shows only slight narrowing and a slight osteophyte off the superior lateral femoral head and neck on both hips that is small.    PATIENT SURVEYS:  Patient-Specific Activity Scoring Scheme  0 represents "unable to perform." 10 represents "able to perform at prior level. 0 1 2 3 4 5 6 7 8 9  10 (Date and Score)   Activity Eval  07/04/2024    1. standing (more than 2 hours)  3    2. getting out chairs/bed 6     3. stairs  0   4.    5.    Score 3    Total score = sum of the activity scores/number of activities Minimum detectable change (90%CI) for average score = 2 points Minimum detectable change (90%CI) for single activity score = 3 points  COGNITION: 07/04/2024 Overall cognitive status: WFL    SENSATION: 07/04/2024 WFL  EDEMA:  07/04/2024 not tested  MUSCLE LENGTH:  07/04/2024 not tested  POSTURE:  07/04/2024 rounded shoulders, forward head, stands with both LEs in ER, and wide BOS  PALPATION: not tested  LOWER EXTREMITY ROM:   ROM Right Eval 07/04/2024  Left Eval 07/04/2024  Hip flexion    Hip extension    Hip abduction    Hip adduction    Hip internal rotation 16 AROM in seated 15 AROM in  seated   Hip external rotation 28 AROM in seated   PROM in supine: 65* 42 AROM in seated  PROM in supine: 55*  Knee flexion    Knee extension    Ankle dorsiflexion    Ankle plantarflexion    Ankle inversion    Ankle eversion     (Blank rows = not tested)  LOWER EXTREMITY MMT:  MMT Right Eval 07/04/2024 Left Eval 07/04/2024  Hip flexion 4+/5 5/5  Hip extension 4-/5 4-/5  Hip abduction 5/5 5/5  Hip adduction    Hip internal rotation    Hip external rotation    Knee flexion 5/5 5/5  Knee extension 5/5 5/5  Ankle dorsiflexion 5/5 5/5  Ankle plantarflexion    Ankle inversion  Ankle eversion     (Blank rows = not tested)  LOWER EXTREMITY SPECIAL TESTS:  07/04/2024 not tested   FUNCTIONAL TESTS:  07/04/2024 18 inch chair transfer: needs some UE support, stands with both legs in ER, with wide BOS  GAIT: 07/04/2024 Distance walked: 50', independent  Comments: wide BOS, LEs in ER, short step length                                                                                                                                                                        TODAY'S TREATMENT                                                                          DATE:    07/20/24  Scifit bike L4x8 minutes for w/u  Shuttle BLE press 75# toes neutral x12, 87# x10  Shuttle single LE press 50# x12 B, 62# x10 B  Attempted single leg bridges unable Staggered bridges x10 B Sidelying hip ABD x10 B Reverse clam no resistance x12 B Hip hikes x12 B  Hip hikes + ABD x12 B IR box walks x12 B      07/04/2024 Therex: HEP instruction/performance c cues for techniques, handout provided.  Trial set performed of each for comprehension and symptom assessment.  See below for exercise list  Self-Care: PT educated patient on ways to minimize discomfort during long periods of standing and walking. Patient verbalized understanding.  Pain monitoring scale given for self  assessment/management.   PATIENT EDUCATION:  Education details: HEP, POC Person educated: Patient Education method: Programmer, multimedia, Demonstration, Verbal cues, and Handouts Education comprehension: verbalized understanding, returned demonstration, and verbal cues required  HOME EXERCISE PROGRAM:  Access Code: [email or web address] URL: [email or web address] Date: 07/20/2024 Prepared by: Josette Rough  Exercises - Supine Bridge with Resistance Band  - 1-2 x daily - 7 x weekly - 1-2 sets - 10-15 reps - 2 hold - Clamshell with Resistance  - 1-2 x daily - 7 x weekly - 2-3 sets - 10-15 reps - Standing Hip Hiking  - 1-2 x daily - 7 x weekly - 1 sets - 10 reps - 5 hold - Seated SLR  - 1-2 x daily - 7 x weekly - 1-2 sets - 10-15 reps - 2 hold - Sidelying Hip Abduction  - 1 x daily - 7 x weekly - 2 sets - 10 reps - Sidelying Reverse Clamshell  - 1 x daily - 7 x weekly -  2 sets - 10 reps    ASSESSMENT:  CLINICAL IMPRESSION:   Arrives today doing well, HEP has been helping. Worked on functional strengthening and hip ROM as tolerated, updated HEP as appropriate. Will continue to challenge as able.    EVAL: Patient is a 23 y.o. who comes to clinic with complaints of bilateral hip pain with mobility, strength and movement coordination deficits that impair their ability to perform usual daily and recreational functional activities without increase difficulty/symptoms at this time.  Patient to benefit from skilled PT services to address impairments and limitations to improve to previous level of function without restriction secondary to condition.   OBJECTIVE IMPAIRMENTS: Abnormal gait, decreased activity tolerance, decreased endurance, decreased mobility, difficulty walking, decreased strength, improper body mechanics, postural dysfunction, and pain.   ACTIVITY LIMITATIONS: lifting, bending, sitting, standing, squatting, stairs, bathing, and locomotion level  PARTICIPATION LIMITATIONS:  cleaning, laundry, community activity, and yard work  PERSONAL FACTORS: Behavior pattern, Fitness, and Time since onset of injury/illness/exacerbation are also affecting patient's functional outcome.   REHAB POTENTIAL: Good  CLINICAL DECISION MAKING: Stable/uncomplicated  EVALUATION COMPLEXITY: Low   GOALS: Goals reviewed with patient? Yes  SHORT TERM GOALS: (target date for Short term goals: 07/25/2024)   1.  Patient will demonstrate independent use of home exercise program to maintain progress from in clinic treatments.  Goal status: New  LONG TERM GOALS: (target dates for all long term goals: 09/12/2024 )   1. Patient will demonstrate/report pain at worst less than or equal to 2/10 to facilitate minimal limitation in daily activity secondary to pain symptoms.  Goal status: New   2. Patient will demonstrate independent use of home exercise program to facilitate ability to maintain/progress functional gains from skilled physical therapy services.  Goal status: New   3. Patient will demonstrate Patient specific functional scale avg > or = 8 to indicate reduced disability due to condition.   Goal status: New   4.  Patient will demonstrate bilateral Hip MMT 5/5 throughout to faciltiate usual transfers, stairs, squatting at [facility] for daily life.   Goal status: New  5. Patient will demonstrate ascending/descending stairs reciprocally s UE assist for community integration.   Goal Status : new  6. Patient will demonstrate/report ability to perform workout routine at gym for exercise benefits.   PLAN:  PT FREQUENCY: 1-2x/week  PT DURATION: 10 weeks  PLANNED INTERVENTIONS: Can include 02853- PT Re-evaluation, 97110-Therapeutic exercises, 97530- Therapeutic activity, [id number]- Neuromuscular re-education, 97535- Self Care, 97140- Manual therapy, [phone]- Gait training, [phone]- Orthotic Fit/training, [phone]- Canalith repositioning, [id number]- Aquatic Therapy, [phone]- Electrical stimulation  (unattended), [id number] Physical performance testing, 97016- Vasopneumatic device, [id number]- Ultrasound, [id number]- Traction (mechanical), [id number]- Ionotophoresis 4mg /ml Dexamethasone ,  [id number] - Needle insertion w/o injection 1 or 2 muscles, 20561 - Needle insertion w/o injection 3 or more muscles.   Patient/Family education, Balance training, Stair training, Taping, Dry Needling, Joint mobilization, Joint manipulation, Spinal manipulation, Spinal mobilization, Scar mobilization, Vestibular training, Visual/preceptual remediation/compensation, DME instructions, Cryotherapy, and Moist heat.  All performed as medically necessary.  All included unless contraindicated  PLAN FOR NEXT SESSION: Review HEP knowledge/results. Progress strengthening exercises for hip musculature and focus on correcting ER   Josette Rough, PT, DPT 07/20/24 11:06 AM

## 2024-07-24 ENCOUNTER — Encounter: Admitting: Physical Therapy

## 2024-07-24 NOTE — Therapy (Signed)
 OUTPATIENT PHYSICAL THERAPY TREATMENT    Patient Name: Juan Valencia MRN: [id number] DOB:2001/08/17, 23 y.o., male Today's Date: 07/25/2024  END OF SESSION:  PT End of Session - 07/25/24 1101     Visit Number 3    Number of Visits 10    Date for PT Re-Evaluation 09/12/24    Authorization Type [employer]    PT Start Time 1101    PT Stop Time 1143    PT Time Calculation (min) 42 min    Activity Tolerance Patient tolerated treatment well    Behavior During Therapy WFL for tasks assessed/performed            Past Medical History:  Diagnosis Date   ADHD (attention deficit hyperactivity disorder)    Anemia    Anxiety    COVID-19    Diabetes mellitus without complication (HCC)    Gross motor development delay    Pneumonia    PONV (postoperative nausea and vomiting)    Portal vein thrombosis    SCFE (slipped capital femoral epiphysis)    Speech delay    Tachycardia, unspecified    Velopharyngeal insufficiency, congenital    Past Surgical History:  Procedure Laterality Date   APPENDECTOMY     HARDWARE REMOVAL Bilateral 07/26/2019   Procedure: removal of hardware bilateral hips;  Surgeon: Addie Cordella Hamilton, MD;  Location: [facility] OR;  Service: Orthopedics;  Laterality: Bilateral;   HIP PINNING,CANNULATED Bilateral 05/26/2017   Procedure: BILATERAL CANNULATED HIP PINNING;  Surgeon: Addie Hamilton Cordella, MD;  Location: [facility] OR;  Service: Orthopedics;  Laterality: Bilateral;   HIP SURGERY Bilateral    LAPAROSCOPIC APPENDECTOMY N/A 09/23/2021   Procedure: APPENDECTOMY LAPAROSCOPIC;  Surgeon: Rubin Calamity, MD;  Location: [facility] ORS;  Service: General;  Laterality: N/A;   PHARYNGEAL FLAP     PHARYNGEAL FLAP REVISION     Patient Active Problem List   Diagnosis Date Noted   Diabetes (HCC) 08/01/2021   Anemia 06/14/2021   Hepatic steatosis 06/14/2021   Perforated appendicitis 06/12/2021   Thrombosis, portal vein 06/12/2021   Hypokalemia    COVID-19 07/26/2020   SCFE (slipped capital  femoral epiphysis), right    SCFE (slipped capital femoral epiphysis), left    SCFE (slipped capital femoral epiphysis) 05/26/2017   Growth hormone deficiency (HCC) 03/31/2017   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   Obesity peds (BMI >=95 percentile) 04/10/2015   Delayed bone age 24/14/2015   Short stature 05/02/2012   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   Velopharyngeal insufficiency, congenital    ADHD (attention deficit hyperactivity disorder)    Speech delay    Gross motor development delay     PCP: Kip Righter, MD  REFERRING PROVIDER: Vernetta Lonni GRADE*  REFERRING DIAG: [phone] (ICD-10-CM) - Bilateral hip pain   THERAPY DIAG:  Pain of both hip joints  Muscle weakness (generalized)  Other abnormalities of gait and mobility  Abnormal posture  Rationale for Evaluation and Treatment: Rehabilitation  ONSET DATE: referral date: 06/13/2024,   SUBJECTIVE:   SUBJECTIVE STATEMENT:  Patient notes that he has had some rough days without taking the medication, but overall not too bad. Notes that he may have tweaked his back with the supine bridges.   PERTINENT HISTORY:  bilateral slipped capital femoral epiphysis, DM  EVAL: Patient is a 23 y.o. male whom presents with bilateral hip pain with a history of bilateral slipped capital femoral epiphysis.  Patient states he has pain and discomfort since his surgery back in 2022 when surgeons took the hardware out  of his hips.  He has pain with various motions such as getting out of a chair or bed, standing for long periods, and stairs.  A couple months ago patient started going to the gym but stopped once he realized it caused his pain to worsen.  He was doing exercises such as the bike, UE weights, and modified squats. His reason for coming to PT is to discover his limits and create a maintenance program so that he is able to get back to the activities he wants and live w/o pain.   PAIN:  NPRS scale: 0/10  Pain location:   Pain description:  Aggravating factors:  Relieving factors:   PRECAUTIONS: None  WEIGHT BEARING RESTRICTIONS: No  FALLS:  Has patient fallen in last 6 months? No  LIVING ENVIRONMENT: Lives with: lives with their family Lives in: House/apartment Stairs: Yes: Internal: 2 floors steps; on left going up and External: 3 steps; none Has following equipment at home: None  OCCUPATION: home, applying to jobs  PLOF: Independent  PATIENT GOALS: manage pain, understand limitations, maintance program, standing and walking for long periods of time  Next MD visit: Office visit, 07/25/2024   OBJECTIVE:   DIAGNOSTIC FINDINGS:   XR HIPS BILAT W OR W/O PELVIS 2V An AP pelvis and lateral both hips shows a history of previous screw removal from both hips.  The joint space shows only slight narrowing and a slight osteophyte off the superior lateral femoral head and neck on both hips that is small.    PATIENT SURVEYS:  Patient-Specific Activity Scoring Scheme  0 represents "unable to perform." 10 represents "able to perform at prior level. 0 1 2 3 4 5 6 7 8 9  10 (Date and Score)   Activity Eval  07/04/2024    1. standing (more than 2 hours)  3    2. getting out chairs/bed 6     3. stairs  0   4.    5.    Score 3    Total score = sum of the activity scores/number of activities Minimum detectable change (90%CI) for average score = 2 points Minimum detectable change (90%CI) for single activity score = 3 points  COGNITION: 07/04/2024 Overall cognitive status: WFL    SENSATION: 07/04/2024 WFL  EDEMA:  07/04/2024 not tested  MUSCLE LENGTH:  07/04/2024 not tested  POSTURE:  07/04/2024 rounded shoulders, forward head, stands with both LEs in ER, and wide BOS  PALPATION: not tested  LOWER EXTREMITY ROM:   ROM Right Eval 07/04/2024  Left Eval 07/04/2024  Hip flexion    Hip extension    Hip abduction    Hip adduction    Hip internal rotation 16 AROM in seated 15  AROM in seated   Hip external rotation 28 AROM in seated   PROM in supine: 65* 42 AROM in seated  PROM in supine: 55*  Knee flexion    Knee extension    Ankle dorsiflexion    Ankle plantarflexion    Ankle inversion    Ankle eversion     (Blank rows = not tested)  LOWER EXTREMITY MMT:  MMT Right Eval 07/04/2024 Left Eval 07/04/2024  Hip flexion 4+/5 5/5  Hip extension 4-/5 4-/5  Hip abduction 5/5 5/5  Hip adduction    Hip internal rotation    Hip external rotation    Knee flexion 5/5 5/5  Knee extension 5/5 5/5  Ankle dorsiflexion 5/5 5/5  Ankle plantarflexion    Ankle inversion  Ankle eversion     (Blank rows = not tested)  LOWER EXTREMITY SPECIAL TESTS:  07/04/2024 not tested   FUNCTIONAL TESTS:  07/04/2024 18 inch chair transfer: needs some UE support, stands with both legs in ER, with wide BOS  GAIT: 07/04/2024 Distance walked: 50', independent  Comments: wide BOS, LEs in ER, short step length                                                                                                                                                                        TODAY'S TREATMENT                                                                          DATE:  07/25/2024 TherEx:  Nustep seat 11 level 5 for 3 minutes, level 3 for 3 minutes  Seated windshield wipers 2x10 each direction with 3s hold Attempted bridges, pain in upper back, switched to hip thrusts at edge of mat table with no pain; 3x10  Verbal cues required for foot placement  Forward step ups using 6 step 1x10; noting no issues with this activity, but with reciprocal pattern  Assessed stair climbing with one set of stairs; no difficulty with descending, feeling tight in hips/groin area with ascending as he has to decrease ER with hip flexion in order to ascend  Lateral step ups with 6 inch step 2x10 with focus on decrease ER, though not reaching neutral   Discussed anteversion/retroversion and how  there is no normal, plan for following visits, and change of HEP to include hip thrusts instead of glute bridges as they decreased his pain  07/20/24  Scifit bike L4x8 minutes for w/u  Shuttle BLE press 75# toes neutral x12, 87# x10  Shuttle single LE press 50# x12 B, 62# x10 B  Attempted single leg bridges unable Staggered bridges x10 B Sidelying hip ABD x10 B Reverse clam no resistance x12 B Hip hikes x12 B  Hip hikes + ABD x12 B IR box walks x12 B      07/04/2024 Therex: HEP instruction/performance c cues for techniques, handout provided.  Trial set performed of each for comprehension and symptom assessment.  See below for exercise list  Self-Care: PT educated patient on ways to minimize discomfort during long periods of standing and walking. Patient verbalized understanding.  Pain monitoring scale given for self assessment/management.   PATIENT EDUCATION:  Education details: HEP, POC Person educated: Patient Education method: Programmer, multimedia, Demonstration,  Verbal cues, and Handouts Education comprehension: verbalized understanding, returned demonstration, and verbal cues required  HOME EXERCISE PROGRAM:  Access Code: [email or web address] URL: [email or web address] Date: 07/20/2024 Prepared by: Josette Rough  Exercises - Supine Bridge with Resistance Band  - 1-2 x daily - 7 x weekly - 1-2 sets - 10-15 reps - 2 hold - Clamshell with Resistance  - 1-2 x daily - 7 x weekly - 2-3 sets - 10-15 reps - Standing Hip Hiking  - 1-2 x daily - 7 x weekly - 1 sets - 10 reps - 5 hold - Seated SLR  - 1-2 x daily - 7 x weekly - 1-2 sets - 10-15 reps - 2 hold - Sidelying Hip Abduction  - 1 x daily - 7 x weekly - 2 sets - 10 reps - Sidelying Reverse Clamshell  - 1 x daily - 7 x weekly - 2 sets - 10 reps    ASSESSMENT:  CLINICAL IMPRESSION: Patient arrived to session with no change in hip symptoms, but did have some upper/mid thoracic pain with glute bridges during HEP. After  session, PT changed glute bridges to hip thrusts as this did not cause any pain in back. Patient endorsing biggest difficulty with stairs is endurance with decreased ER which leads him to feel like he will lock out and not be able to do any more. PT discussed plan to improve endurance with this movement as well as continue strengthening to return to typical activities. Patient will continue to benefit from skilled PT.   OBJECTIVE IMPAIRMENTS: Abnormal gait, decreased activity tolerance, decreased endurance, decreased mobility, difficulty walking, decreased strength, improper body mechanics, postural dysfunction, and pain.   ACTIVITY LIMITATIONS: lifting, bending, sitting, standing, squatting, stairs, bathing, and locomotion level  PARTICIPATION LIMITATIONS: cleaning, laundry, community activity, and yard work  PERSONAL FACTORS: Behavior pattern, Fitness, and Time since onset of injury/illness/exacerbation are also affecting patient's functional outcome.   REHAB POTENTIAL: Good  CLINICAL DECISION MAKING: Stable/uncomplicated  EVALUATION COMPLEXITY: Low   GOALS: Goals reviewed with patient? Yes  SHORT TERM GOALS: (target date for Short term goals: 07/25/2024)   1.  Patient will demonstrate independent use of home exercise program to maintain progress from in clinic treatments.  Goal status: New  LONG TERM GOALS: (target dates for all long term goals: 09/12/2024 )   1. Patient will demonstrate/report pain at worst less than or equal to 2/10 to facilitate minimal limitation in daily activity secondary to pain symptoms.  Goal status: New   2. Patient will demonstrate independent use of home exercise program to facilitate ability to maintain/progress functional gains from skilled physical therapy services.  Goal status: New   3. Patient will demonstrate Patient specific functional scale avg > or = 8 to indicate reduced disability due to condition.   Goal status: New   4.  Patient  will demonstrate bilateral Hip MMT 5/5 throughout to faciltiate usual transfers, stairs, squatting at [facility] for daily life.   Goal status: New  5. Patient will demonstrate ascending/descending stairs reciprocally s UE assist for community integration.   Goal Status : new  6. Patient will demonstrate/report ability to perform workout routine at gym for exercise benefits.   PLAN:  PT FREQUENCY: 1-2x/week  PT DURATION: 10 weeks  PLANNED INTERVENTIONS: Can include 02853- PT Re-evaluation, 97110-Therapeutic exercises, 97530- Therapeutic activity, [id number]- Neuromuscular re-education, 97535- Self Care, [id number]- Manual therapy, [id number]- Gait training, [phone]- Orthotic Fit/training, [phone]- Canalith repositioning, [id number]- Aquatic Therapy, [id number]- Electrical stimulation (unattended), [id number] Physical performance testing, 97016-  Vasopneumatic device, [id number]- Ultrasound, 02987- Traction (mechanical), [id number]- Ionotophoresis 4mg /ml Dexamethasone ,  [id number] - Needle insertion w/o injection 1 or 2 muscles, 20561 - Needle insertion w/o injection 3 or more muscles.   Patient/Family education, Balance training, Stair training, Taping, Dry Needling, Joint mobilization, Joint manipulation, Spinal manipulation, Spinal mobilization, Scar mobilization, Vestibular training, Visual/preceptual remediation/compensation, DME instructions, Cryotherapy, and Moist heat.  All performed as medically necessary.  All included unless contraindicated  PLAN FOR NEXT SESSION: Review HEP knowledge/results. Progress strengthening exercises for hip musculature and focus on correcting ER/stretching into IR   Susannah Daring, PT, DPT 07/25/24 12:50 PM

## 2024-07-25 ENCOUNTER — Encounter: Payer: Self-pay | Admitting: Orthopaedic Surgery

## 2024-07-25 ENCOUNTER — Ambulatory Visit: Admitting: Orthopaedic Surgery

## 2024-07-25 ENCOUNTER — Other Ambulatory Visit: Payer: Self-pay

## 2024-07-25 ENCOUNTER — Encounter: Payer: Self-pay | Admitting: Pharmacist

## 2024-07-25 ENCOUNTER — Ambulatory Visit (INDEPENDENT_AMBULATORY_CARE_PROVIDER_SITE_OTHER)

## 2024-07-25 ENCOUNTER — Other Ambulatory Visit (HOSPITAL_COMMUNITY): Payer: Self-pay

## 2024-07-25 DIAGNOSIS — R2689 Other abnormalities of gait and mobility: Secondary | ICD-10-CM | POA: Diagnosis not present

## 2024-07-25 DIAGNOSIS — M25551 Pain in right hip: Secondary | ICD-10-CM | POA: Diagnosis not present

## 2024-07-25 DIAGNOSIS — R293 Abnormal posture: Secondary | ICD-10-CM | POA: Diagnosis not present

## 2024-07-25 DIAGNOSIS — M25552 Pain in left hip: Secondary | ICD-10-CM

## 2024-07-25 DIAGNOSIS — M6281 Muscle weakness (generalized): Secondary | ICD-10-CM | POA: Diagnosis not present

## 2024-07-25 MED ORDER — CELECOXIB 200 MG PO CAPS
200.0000 mg | ORAL_CAPSULE | Freq: Two times a day (BID) | ORAL | 3 refills | Status: AC | PRN
  Filled 2024-07-25 (×2): qty 60, 30d supply, fill #0

## 2024-07-25 NOTE — Progress Notes (Signed)
 The patient comes in for follow-up as a relates to his bilateral hip pain and stiffness.  Again he is only 23 years old and has a history of previous slipped capital femoral epiphysis and had hardware that was eventually removed.  He says therapy has done a really good job with helping decrease his stiffness.  He says Celebrex  has helped as well.  He says that the physical therapist provided him with a lot of things he can do on his own as well as therapy in general as an outpatient.  He did have a therapy session today.  On exam his hips to move a little better with less stiffness and he appears with less discomfort.  He will continue increase his activities as comfort allows and will continue stretching activities.  He has a few more therapy sessions that he should keep.  From our standpoint we will see him in 6 months to see how he is doing overall but no x-rays are needed unless he is having some pain and discomfort.

## 2024-07-30 ENCOUNTER — Other Ambulatory Visit: Payer: Self-pay

## 2024-08-01 ENCOUNTER — Ambulatory Visit (INDEPENDENT_AMBULATORY_CARE_PROVIDER_SITE_OTHER): Admitting: Rehabilitative and Restorative Service Providers"

## 2024-08-01 ENCOUNTER — Encounter: Payer: Self-pay | Admitting: Rehabilitative and Restorative Service Providers"

## 2024-08-01 DIAGNOSIS — R2689 Other abnormalities of gait and mobility: Secondary | ICD-10-CM

## 2024-08-01 DIAGNOSIS — M6281 Muscle weakness (generalized): Secondary | ICD-10-CM | POA: Diagnosis not present

## 2024-08-01 DIAGNOSIS — R293 Abnormal posture: Secondary | ICD-10-CM

## 2024-08-01 DIAGNOSIS — M25552 Pain in left hip: Secondary | ICD-10-CM

## 2024-08-01 DIAGNOSIS — M25551 Pain in right hip: Secondary | ICD-10-CM

## 2024-08-01 NOTE — Therapy (Signed)
 OUTPATIENT PHYSICAL THERAPY TREATMENT    Patient Name: Juan Valencia MRN: [id number] DOB:08/26/2001, 23 y.o., male Today's Date: 08/01/2024  END OF SESSION:  PT End of Session - 08/01/24 1509     Visit Number 4    Number of Visits 10    Date for PT Re-Evaluation 09/12/24    Authorization Type [employer]    PT Start Time 1509    PT Stop Time 1549    PT Time Calculation (min) 40 min    Activity Tolerance Patient tolerated treatment well    Behavior During Therapy WFL for tasks assessed/performed             Past Medical History:  Diagnosis Date   ADHD (attention deficit hyperactivity disorder)    Anemia    Anxiety    COVID-19    Diabetes mellitus without complication (HCC)    Gross motor development delay    Pneumonia    PONV (postoperative nausea and vomiting)    Portal vein thrombosis    SCFE (slipped capital femoral epiphysis)    Speech delay    Tachycardia, unspecified    Velopharyngeal insufficiency, congenital    Past Surgical History:  Procedure Laterality Date   APPENDECTOMY     HARDWARE REMOVAL Bilateral 07/26/2019   Procedure: removal of hardware bilateral hips;  Surgeon: Addie Cordella Hamilton, MD;  Location: [facility] OR;  Service: Orthopedics;  Laterality: Bilateral;   HIP PINNING,CANNULATED Bilateral 05/26/2017   Procedure: BILATERAL CANNULATED HIP PINNING;  Surgeon: Addie Hamilton Cordella, MD;  Location: [facility] OR;  Service: Orthopedics;  Laterality: Bilateral;   HIP SURGERY Bilateral    LAPAROSCOPIC APPENDECTOMY N/A 09/23/2021   Procedure: APPENDECTOMY LAPAROSCOPIC;  Surgeon: Rubin Calamity, MD;  Location: [facility] ORS;  Service: General;  Laterality: N/A;   PHARYNGEAL FLAP     PHARYNGEAL FLAP REVISION     Patient Active Problem List   Diagnosis Date Noted   Diabetes (HCC) 08/01/2021   Anemia 06/14/2021   Hepatic steatosis 06/14/2021   Perforated appendicitis 06/12/2021   Thrombosis, portal vein 06/12/2021   Hypokalemia    COVID-19 07/26/2020   SCFE (slipped capital  femoral epiphysis), right    SCFE (slipped capital femoral epiphysis), left    SCFE (slipped capital femoral epiphysis) 05/26/2017   Growth hormone deficiency (HCC) 03/31/2017   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   Obesity peds (BMI >=95 percentile) 04/10/2015   Delayed bone age 22/14/2015   Short stature 05/02/2012   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   Velopharyngeal insufficiency, congenital    ADHD (attention deficit hyperactivity disorder)    Speech delay    Gross motor development delay     PCP: Kip Righter, MD  REFERRING PROVIDER: Vernetta Lonni GRADE*  REFERRING DIAG: [phone] (ICD-10-CM) - Bilateral hip pain   THERAPY DIAG:  Pain of both hip joints  Muscle weakness (generalized)  Other abnormalities of gait and mobility  Abnormal posture  Rationale for Evaluation and Treatment: Rehabilitation  ONSET DATE: referral date: 06/13/2024,   SUBJECTIVE:   SUBJECTIVE STATEMENT: Pt indicated extra tight today and symptoms around 2/10.   Pt indicated feeling like he is understanding things better with exercise.    PERTINENT HISTORY:  bilateral slipped capital femoral epiphysis, DM  EVAL: Patient is a 23 y.o. male whom presents with bilateral hip pain with a history of bilateral slipped capital femoral epiphysis.  Patient states he has pain and discomfort since his surgery back in 2022 when surgeons took the hardware out of his hips.  He has pain  with various motions such as getting out of a chair or bed, standing for long periods, and stairs.  A couple months ago patient started going to the gym but stopped once he realized it caused his pain to worsen.  He was doing exercises such as the bike, UE weights, and modified squats. His reason for coming to PT is to discover his limits and create a maintenance program so that he is able to get back to the activities he wants and live w/o pain.   PAIN:  NPRS scale: 2/10 Pain location:  hips Pain description:  tight Aggravating factors: humid times Relieving factors: nothing specific.   PRECAUTIONS: None  WEIGHT BEARING RESTRICTIONS: No  FALLS:  Has patient fallen in last 6 months? No  LIVING ENVIRONMENT: Lives with: lives with their family Lives in: House/apartment Stairs: Yes: Internal: 2 floors steps; on left going up and External: 3 steps; none Has following equipment at home: None  OCCUPATION: home, applying to jobs  PLOF: Independent  PATIENT GOALS: manage pain, understand limitations, maintance program, standing and walking for long periods of time  Next MD visit: Office visit, 07/25/2024   OBJECTIVE:   DIAGNOSTIC FINDINGS:   XR HIPS BILAT W OR W/O PELVIS 2V An AP pelvis and lateral both hips shows a history of previous screw removal from both hips.  The joint space shows only slight narrowing and a slight osteophyte off the superior lateral femoral head and neck on both hips that is small.    PATIENT SURVEYS:  Patient-Specific Activity Scoring Scheme  0 represents "unable to perform." 10 represents "able to perform at prior level. 0 1 2 3 4 5 6 7 8 9  10 (Date and Score)   Activity Eval  07/04/2024  08/01/2024  1. standing (more than 2 hours)  3 8   2. getting out chairs/bed 6  8   3. stairs  0 6  4.    5.    Score 3 7.33   Total score = sum of the activity scores/number of activities Minimum detectable change (90%CI) for average score = 2 points Minimum detectable change (90%CI) for single activity score = 3 points  COGNITION: 07/04/2024 Overall cognitive status: WFL    SENSATION: 07/04/2024 WFL  EDEMA:  07/04/2024 not tested  MUSCLE LENGTH:  07/04/2024 not tested  POSTURE:  07/04/2024 rounded shoulders, forward head, stands with both LEs in ER, and wide BOS  PALPATION: not tested  LOWER EXTREMITY ROM:   ROM Right Eval 07/04/2024  Left Eval 07/04/2024 Right 08/01/2024 Left 08/01/2024  Hip flexion      Hip extension      Hip abduction       Hip adduction      Hip internal rotation 16 AROM in seated 15 AROM in seated  24 AROM in 90 deg flexion supine 20 AROM in 90 deg flexion supine  Hip external rotation 28 AROM in seated   PROM in supine: 65* 42 AROM in seated  PROM in supine: 55* 56 AROM in 90 deg flexion supine 60 AROM in 90 deg flexion supine  Knee flexion      Knee extension      Ankle dorsiflexion      Ankle plantarflexion      Ankle inversion      Ankle eversion       (Blank rows = not tested)  LOWER EXTREMITY MMT:  MMT Right Eval 07/04/2024 Left Eval 07/04/2024  Hip flexion 4+/5 5/5  Hip extension  4-/5 4-/5  Hip abduction 5/5 5/5  Hip adduction    Hip internal rotation    Hip external rotation    Knee flexion 5/5 5/5  Knee extension 5/5 5/5  Ankle dorsiflexion 5/5 5/5  Ankle plantarflexion    Ankle inversion    Ankle eversion     (Blank rows = not tested)  LOWER EXTREMITY SPECIAL TESTS:  07/04/2024 not tested   FUNCTIONAL TESTS:  07/04/2024 18 inch chair transfer: needs some UE support, stands with both legs in ER, with wide BOS  GAIT: 07/04/2024 Distance walked: 50', independent  Comments: wide BOS, LEs in ER, short step length                                                                                                                                                                        TODAY'S TREATMENT                                                                          DATE: 08/01/2024 Therex: Nustep LE only lvl 5 10 mins for aerobic exercise, pushing with legs pressure Supine hooklying figure 4 push away 15 sec x 3 bilaterally Lateral stepping 15 ft x 3 each way with green band around lower leg  TherActivity (to improve stair navigation, transfers) Lateral step down slow lowering focus 6 inch step x 15 bilaterally.  Time spent for demo and instruction.   Neuro Re-ed (balance and muscle activation improvmeents) Y balance reaching with contralateral light touch to floor x 8  each way, performed bilaterally.  Slow movement control focus.  Time spent for demo and education.      TODAY'S TREATMENT                                                                          DATE: 07/25/2024 TherEx:  Nustep seat 11 level 5 for 3 minutes, level 3 for 3 minutes  Seated windshield wipers 2x10 each direction with 3s hold Attempted bridges, pain in upper back, switched to hip thrusts at edge of mat table with no pain; 3x10  Verbal cues required for foot placement  Forward step ups using 6 step 1x10; noting no  issues with this activity, but with reciprocal pattern  Assessed stair climbing with one set of stairs; no difficulty with descending, feeling tight in hips/groin area with ascending as he has to decrease ER with hip flexion in order to ascend  Lateral step ups with 6 inch step 2x10 with focus on decrease ER, though not reaching neutral   Discussed anteversion/retroversion and how there is no normal, plan for following visits, and change of HEP to include hip thrusts instead of glute bridges as they decreased his pain  TODAY'S TREATMENT                                                                          DATE:07/20/24  Scifit bike L4x8 minutes for w/u  Shuttle BLE press 75# toes neutral x12, 87# x10  Shuttle single LE press 50# x12 B, 62# x10 B  Attempted single leg bridges unable Staggered bridges x10 B Sidelying hip ABD x10 B Reverse clam no resistance x12 B Hip hikes x12 B  Hip hikes + ABD x12 B IR box walks x12 B  PATIENT EDUCATION:  Education details: HEP, POC Person educated: Patient Education method: Programmer, multimedia, Facilities manager, Verbal cues, and Handouts Education comprehension: verbalized understanding, returned demonstration, and verbal cues required  HOME EXERCISE PROGRAM:  Access Code: [email or web address] URL: [email or web address] Date: 07/20/2024 Prepared by: Josette Rough  Exercises - Supine Bridge with Resistance Band  - 1-2 x  daily - 7 x weekly - 1-2 sets - 10-15 reps - 2 hold - Clamshell with Resistance  - 1-2 x daily - 7 x weekly - 2-3 sets - 10-15 reps - Standing Hip Hiking  - 1-2 x daily - 7 x weekly - 1 sets - 10 reps - 5 hold - Seated SLR  - 1-2 x daily - 7 x weekly - 1-2 sets - 10-15 reps - 2 hold - Sidelying Hip Abduction  - 1 x daily - 7 x weekly - 2 sets - 10 reps - Sidelying Reverse Clamshell  - 1 x daily - 7 x weekly - 2 sets - 10 reps    ASSESSMENT:  CLINICAL IMPRESSION: Improvement noted in ROM recheck and well as patient specific functional scale reporting. May continue to benefit from progressive WB strengthening and movement coordination improvements in clinic and in HEP.    OBJECTIVE IMPAIRMENTS: Abnormal gait, decreased activity tolerance, decreased endurance, decreased mobility, difficulty walking, decreased strength, improper body mechanics, postural dysfunction, and pain.   ACTIVITY LIMITATIONS: lifting, bending, sitting, standing, squatting, stairs, bathing, and locomotion level  PARTICIPATION LIMITATIONS: cleaning, laundry, community activity, and yard work  PERSONAL FACTORS: Behavior pattern, Fitness, and Time since onset of injury/illness/exacerbation are also affecting patient's functional outcome.   REHAB POTENTIAL: Good  CLINICAL DECISION MAKING: Stable/uncomplicated  EVALUATION COMPLEXITY: Low   GOALS: Goals reviewed with patient? Yes  SHORT TERM GOALS: (target date for Short term goals: 07/25/2024)   1.  Patient will demonstrate independent use of home exercise program to maintain progress from in clinic treatments.  Goal status: Met in review  LONG TERM GOALS: (target dates for all long term goals: 09/12/2024 )   1. Patient will demonstrate/report pain at worst less than or equal to 2/10 to facilitate minimal limitation in  daily activity secondary to pain symptoms.  Goal status: on going 08/01/2024   2. Patient will demonstrate independent use of home exercise  program to facilitate ability to maintain/progress functional gains from skilled physical therapy services.  Goal status: on going 08/01/2024   3. Patient will demonstrate Patient specific functional scale avg > or = 8 to indicate reduced disability due to condition.   Goal status: on going 08/01/2024   4.  Patient will demonstrate bilateral Hip MMT 5/5 throughout to faciltiate usual transfers, stairs, squatting at [facility] for daily life.   Goal status: on going 08/01/2024  5. Patient will demonstrate ascending/descending stairs reciprocally s UE assist for community integration.   Goal Status : on going 08/01/2024  6. Patient will demonstrate/report ability to perform workout routine at gym for exercise benefits.  on going 08/01/2024  PLAN:  PT FREQUENCY: 1-2x/week  PT DURATION: 10 weeks  PLANNED INTERVENTIONS: Can include 02853- PT Re-evaluation, 97110-Therapeutic exercises, 97530- Therapeutic activity, 97112- Neuromuscular re-education, 97535- Self Care, 97140- Manual therapy, [phone]- Gait training, [phone]- Orthotic Fit/training, [phone]- Canalith repositioning, [id number]- Aquatic Therapy, [phone]- Electrical stimulation (unattended), [id number] Physical performance testing, 97016- Vasopneumatic device, [id number]- Ultrasound, [id number]- Traction (mechanical), [id number]- Ionotophoresis 4mg /ml Dexamethasone ,  [id number] - Needle insertion w/o injection 1 or 2 muscles, 20561 - Needle insertion w/o injection 3 or more muscles.   Patient/Family education, Balance training, Stair training, Taping, Dry Needling, Joint mobilization, Joint manipulation, Spinal manipulation, Spinal mobilization, Scar mobilization, Vestibular training, Visual/preceptual remediation/compensation, DME instructions, Cryotherapy, and Moist heat.  All performed as medically necessary.  All included unless contraindicated  PLAN FOR NEXT SESSION: LE strengthening and stability improvements.    Ozell Silvan, PT, DPT, OCS, ATC 08/01/24  3:49 PM

## 2024-08-09 ENCOUNTER — Encounter: Payer: Self-pay | Admitting: Rehabilitative and Restorative Service Providers"

## 2024-08-09 ENCOUNTER — Ambulatory Visit (INDEPENDENT_AMBULATORY_CARE_PROVIDER_SITE_OTHER): Admitting: Rehabilitative and Restorative Service Providers"

## 2024-08-09 DIAGNOSIS — R2689 Other abnormalities of gait and mobility: Secondary | ICD-10-CM | POA: Diagnosis not present

## 2024-08-09 DIAGNOSIS — R293 Abnormal posture: Secondary | ICD-10-CM | POA: Diagnosis not present

## 2024-08-09 DIAGNOSIS — M25552 Pain in left hip: Secondary | ICD-10-CM

## 2024-08-09 DIAGNOSIS — M6281 Muscle weakness (generalized): Secondary | ICD-10-CM

## 2024-08-09 DIAGNOSIS — M25551 Pain in right hip: Secondary | ICD-10-CM

## 2024-08-09 NOTE — Therapy (Signed)
 OUTPATIENT PHYSICAL THERAPY TREATMENT / DISCHARGE   Patient Name: Juan Valencia MRN: [id number] DOB:2001-03-19, 23 y.o., male Today's Date: 08/09/2024  PHYSICAL THERAPY DISCHARGE SUMMARY  Visits from Start of Care: 5  Current functional level related to goals / functional outcomes: See note   Remaining deficits: See note   Education / Equipment: HEP  Patient goals were met. Patient is being discharged due to being pleased with the current functional level.   END OF SESSION:  PT End of Session - 08/09/24 1528     Visit Number 5    Number of Visits 10    Date for PT Re-Evaluation 09/12/24    Authorization Type [employer]    PT Start Time 1525 (P)     PT Stop Time 1550 (P)     PT Time Calculation (min) 25 min (P)     Activity Tolerance Patient tolerated treatment well    Behavior During Therapy WFL for tasks assessed/performed              Past Medical History:  Diagnosis Date   ADHD (attention deficit hyperactivity disorder)    Anemia    Anxiety    COVID-19    Diabetes mellitus without complication (HCC)    Gross motor development delay    Pneumonia    PONV (postoperative nausea and vomiting)    Portal vein thrombosis    SCFE (slipped capital femoral epiphysis)    Speech delay    Tachycardia, unspecified    Velopharyngeal insufficiency, congenital    Past Surgical History:  Procedure Laterality Date   APPENDECTOMY     HARDWARE REMOVAL Bilateral 07/26/2019   Procedure: removal of hardware bilateral hips;  Surgeon: Addie Cordella Hamilton, MD;  Location: [facility] OR;  Service: Orthopedics;  Laterality: Bilateral;   HIP PINNING,CANNULATED Bilateral 05/26/2017   Procedure: BILATERAL CANNULATED HIP PINNING;  Surgeon: Addie Hamilton Cordella, MD;  Location: [facility] OR;  Service: Orthopedics;  Laterality: Bilateral;   HIP SURGERY Bilateral    LAPAROSCOPIC APPENDECTOMY N/A 09/23/2021   Procedure: APPENDECTOMY LAPAROSCOPIC;  Surgeon: Rubin Calamity, MD;  Location: [facility] ORS;  Service:  General;  Laterality: N/A;   PHARYNGEAL FLAP     PHARYNGEAL FLAP REVISION     Patient Active Problem List   Diagnosis Date Noted   Diabetes (HCC) 08/01/2021   Anemia 06/14/2021   Hepatic steatosis 06/14/2021   Perforated appendicitis 06/12/2021   Thrombosis, portal vein 06/12/2021   Hypokalemia    COVID-19 07/26/2020   SCFE (slipped capital femoral epiphysis), right    SCFE (slipped capital femoral epiphysis), left    SCFE (slipped capital femoral epiphysis) 05/26/2017   Growth hormone deficiency (HCC) 03/31/2017   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   Obesity peds (BMI >=95 percentile) 04/10/2015   Delayed bone age 92/14/2015   Short stature 05/02/2012   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   Velopharyngeal insufficiency, congenital    ADHD (attention deficit hyperactivity disorder)    Speech delay    Gross motor development delay     PCP: Kip Righter, MD  REFERRING PROVIDER: Vernetta Lonni GRADE*  REFERRING DIAG: [phone] (ICD-10-CM) - Bilateral hip pain   THERAPY DIAG:  Pain of both hip joints  Muscle weakness (generalized)  Other abnormalities of gait and mobility  Abnormal posture  Rationale for Evaluation and Treatment: Rehabilitation  ONSET DATE: referral date: 06/13/2024,   SUBJECTIVE:   SUBJECTIVE STATEMENT: Pt indicated no pain to talk about and felt like today would be the last visit with gym transitioning.  Pt  rated global rating of change +5.     PERTINENT HISTORY:  bilateral slipped capital femoral epiphysis, DM  EVAL: Patient is a 23 y.o. male whom presents with bilateral hip pain with a history of bilateral slipped capital femoral epiphysis.  Patient states he has pain and discomfort since his surgery back in 2022 when surgeons took the hardware out of his hips.  He has pain with various motions such as getting out of a chair or bed, standing for long periods, and stairs.  A couple months ago patient started going to the gym but stopped once  he realized it caused his pain to worsen.  He was doing exercises such as the bike, UE weights, and modified squats. His reason for coming to PT is to discover his limits and create a maintenance program so that he is able to get back to the activities he wants and live w/o pain.   PAIN:  NPRS scale: 2/10 Pain location:  hips Pain description: tight Aggravating factors: humid times Relieving factors: nothing specific.   PRECAUTIONS: None  WEIGHT BEARING RESTRICTIONS: No  FALLS:  Has patient fallen in last 6 months? No  LIVING ENVIRONMENT: Lives with: lives with their family Lives in: House/apartment Stairs: Yes: Internal: 2 floors steps; on left going up and External: 3 steps; none Has following equipment at home: None  OCCUPATION: home, applying to jobs  PLOF: Independent  PATIENT GOALS: manage pain, understand limitations, maintance program, standing and walking for long periods of time  Next MD visit: Office visit, 07/25/2024   OBJECTIVE:   DIAGNOSTIC FINDINGS:   XR HIPS BILAT W OR W/O PELVIS 2V An AP pelvis and lateral both hips shows a history of previous screw removal from both hips.  The joint space shows only slight narrowing and a slight osteophyte off the superior lateral femoral head and neck on both hips that is small.    PATIENT SURVEYS:  Patient-Specific Activity Scoring Scheme  0 represents "unable to perform." 10 represents "able to perform at prior level. 0 1 2 3 4 5 6 7 8 9  10 (Date and Score)   Activity Eval  07/04/2024  08/01/2024  1. standing (more than 2 hours)  3 8   2. getting out chairs/bed 6  8   3. stairs  0 6  4.    5.    Score 3 7.33   Total score = sum of the activity scores/number of activities Minimum detectable change (90%CI) for average score = 2 points Minimum detectable change (90%CI) for single activity score = 3 points  COGNITION: 07/04/2024 Overall cognitive status: WFL    SENSATION: 07/04/2024 WFL  EDEMA:   07/04/2024 not tested  MUSCLE LENGTH:  07/04/2024 not tested  POSTURE:  07/04/2024 rounded shoulders, forward head, stands with both LEs in ER, and wide BOS  PALPATION: not tested  LOWER EXTREMITY ROM:   ROM Right Eval 07/04/2024  Left Eval 07/04/2024 Right 08/01/2024 Left 08/01/2024  Hip flexion      Hip extension      Hip abduction      Hip adduction      Hip internal rotation 16 AROM in seated 15 AROM in seated  24 AROM in 90 deg flexion supine 20 AROM in 90 deg flexion supine  Hip external rotation 28 AROM in seated   PROM in supine: 65* 42 AROM in seated  PROM in supine: 55* 56 AROM in 90 deg flexion supine 60 AROM in 90 deg flexion supine  Knee flexion      Knee extension      Ankle dorsiflexion      Ankle plantarflexion      Ankle inversion      Ankle eversion       (Blank rows = not tested)  LOWER EXTREMITY MMT:  MMT Right Eval 07/04/2024 Left Eval 07/04/2024  Hip flexion 4+/5 5/5  Hip extension 4-/5 4-/5  Hip abduction 5/5 5/5  Hip adduction    Hip internal rotation    Hip external rotation    Knee flexion 5/5 5/5  Knee extension 5/5 5/5  Ankle dorsiflexion 5/5 5/5  Ankle plantarflexion    Ankle inversion    Ankle eversion     (Blank rows = not tested)  LOWER EXTREMITY SPECIAL TESTS:  07/04/2024 not tested   FUNCTIONAL TESTS:  07/04/2024 18 inch chair transfer: needs some UE support, stands with both legs in ER, with wide BOS  GAIT: 07/04/2024 Distance walked: 50', independent  Comments: wide BOS, LEs in ER, short step length                                                                                                                                                                        TODAY'S TREATMENT                                                                          DATE: 08/09/2024 Therex: Recumbent bike lvl 3-4 10 mins with instruction for use at gym Time spent in review of HEP and gym based activity.  Machine knee  extension 15 lbs double leg up, single leg lowering x 10 each LE Machine knee flexion SL 15 lbs x 10, performed bilaterally   TherActivity Leg press double leg x 30 75 lbs Leg press single leg x 20 37 lbs - performed bilaterally    TODAY'S TREATMENT                                                                          DATE: 08/01/2024 Therex: Nustep LE only lvl 5 10 mins for aerobic exercise, pushing with legs pressure Supine hooklying figure 4 push away 15 sec x 3 bilaterally  Lateral stepping 15 ft x 3 each way with green band around lower leg  TherActivity (to improve stair navigation, transfers) Lateral step down slow lowering focus 6 inch step x 15 bilaterally.  Time spent for demo and instruction.   Neuro Re-ed (balance and muscle activation improvmeents) Y balance reaching with contralateral light touch to floor x 8 each way, performed bilaterally.  Slow movement control focus.  Time spent for demo and education.      TODAY'S TREATMENT                                                                          DATE: 07/25/2024 TherEx:  Nustep seat 11 level 5 for 3 minutes, level 3 for 3 minutes  Seated windshield wipers 2x10 each direction with 3s hold Attempted bridges, pain in upper back, switched to hip thrusts at edge of mat table with no pain; 3x10  Verbal cues required for foot placement  Forward step ups using 6 step 1x10; noting no issues with this activity, but with reciprocal pattern  Assessed stair climbing with one set of stairs; no difficulty with descending, feeling tight in hips/groin area with ascending as he has to decrease ER with hip flexion in order to ascend  Lateral step ups with 6 inch step 2x10 with focus on decrease ER, though not reaching neutral   Discussed anteversion/retroversion and how there is no normal, plan for following visits, and change of HEP to include hip thrusts instead of glute bridges as they decreased his pain  TODAY'S TREATMENT                                                                           DATE:07/20/24  Scifit bike L4x8 minutes for w/u  Shuttle BLE press 75# toes neutral x12, 87# x10  Shuttle single LE press 50# x12 B, 62# x10 B  Attempted single leg bridges unable Staggered bridges x10 B Sidelying hip ABD x10 B Reverse clam no resistance x12 B Hip hikes x12 B  Hip hikes + ABD x12 B IR box walks x12 B  PATIENT EDUCATION:  08/09/2024 Education details: HEP update Person educated: Patient Education method: Programmer, multimedia, Demonstration, Verbal cues, and Handouts Education comprehension: verbalized understanding, returned demonstration, and verbal cues required  HOME EXERCISE PROGRAM: Access Code: [email or web address] URL: [email or web address] Date: 08/09/2024 Prepared by: Ozell Silvan  Exercises - Supine Bridge with Resistance Band  - 1-2 x daily - 7 x weekly - 1-2 sets - 10-15 reps - 2 hold - Supine Figure 4 pull towards  - 1-2 x daily - 7 x weekly - 1 sets - 5 reps - 15-30 hold - Supine Figure 4 Piriformis Stretch  - 1-2 x daily - 7 x weekly - 1 sets - 5 reps - 15-30 hold - Clamshell with Resistance  - 1-2 x daily - 7 x weekly - 2-3 sets - 10-15 reps - Standing Hip Hiking  - 1-2 x daily -  7 x weekly - 1 sets - 10 reps - 5 hold - Seated SLR  - 1-2 x daily - 7 x weekly - 1-2 sets - 10-15 reps - 2 hold - Sidelying Hip Abduction  - 1 x daily - 7 x weekly - 2 sets - 10 reps - Sidelying Reverse Clamshell  - 1 x daily - 7 x weekly - 2 sets - 10 reps - Lateral Step Down  - 1 x daily - 7 x weekly - 1-2 sets - 10-15 reps - Side Stepping with Resistance at Ankles  - 1 x daily - 7 x weekly    ASSESSMENT:  CLINICAL IMPRESSION: The patient has attended 5 visits over the course of treatment cycle.  Patient has reported overall improvement with global rating of change at +5.  See objective data above for updated information regarding current presentation. Pt indicated desire to transition to HEP and gym  today.  Presentation matched and plan for discharge made today.    Decreased time due to later arrival time and discharge today.    OBJECTIVE IMPAIRMENTS: Abnormal gait, decreased activity tolerance, decreased endurance, decreased mobility, difficulty walking, decreased strength, improper body mechanics, postural dysfunction, and pain.   ACTIVITY LIMITATIONS: lifting, bending, sitting, standing, squatting, stairs, bathing, and locomotion level  PARTICIPATION LIMITATIONS: cleaning, laundry, community activity, and yard work  PERSONAL FACTORS: Behavior pattern, Fitness, and Time since onset of injury/illness/exacerbation are also affecting patient's functional outcome.   REHAB POTENTIAL: Good  CLINICAL DECISION MAKING: Stable/uncomplicated  EVALUATION COMPLEXITY: Low   GOALS: Goals reviewed with patient? Yes  SHORT TERM GOALS: (target date for Short term goals: 07/25/2024)   1.  Patient will demonstrate independent use of home exercise program to maintain progress from in clinic treatments.  Goal status: Met in review  LONG TERM GOALS: (target dates for all long term goals: 09/12/2024 )   1. Patient will demonstrate/report pain at worst less than or equal to 2/10 to facilitate minimal limitation in daily activity secondary to pain symptoms.  Goal status: Met 08/09/2024   2. Patient will demonstrate independent use of home exercise program to facilitate ability to maintain/progress functional gains from skilled physical therapy services.  Goal status: met 08/09/2024   3. Patient will demonstrate Patient specific functional scale avg > or = 8 to indicate reduced disability due to condition.   Goal status: on going 08/01/2024   4.  Patient will demonstrate bilateral Hip MMT 5/5 throughout to faciltiate usual transfers, stairs, squatting at [facility] for daily life.   Goal status: on going 08/01/2024  5. Patient will demonstrate ascending/descending stairs reciprocally s UE assist for  community integration.   Goal Status : met 08/09/2024  6. Patient will demonstrate/report ability to perform workout routine at gym for exercise benefits.  met 08/09/2024  PLAN:  PT FREQUENCY: 1-2x/week  PT DURATION: 10 weeks  PLANNED INTERVENTIONS: Can include 02853- PT Re-evaluation, 97110-Therapeutic exercises, 97530- Therapeutic activity, 97112- Neuromuscular re-education, 97535- Self Care, 97140- Manual therapy, [phone]- Gait training, [phone]- Orthotic Fit/training, [phone]- Canalith repositioning, [id number]- Aquatic Therapy, [phone]- Electrical stimulation (unattended), [id number] Physical performance testing, 97016- Vasopneumatic device, [id number]- Ultrasound, [id number]- Traction (mechanical), [id number]- Ionotophoresis 4mg /ml Dexamethasone ,  [id number] - Needle insertion w/o injection 1 or 2 muscles, 20561 - Needle insertion w/o injection 3 or more muscles.   Patient/Family education, Balance training, Stair training, Taping, Dry Needling, Joint mobilization, Joint manipulation, Spinal manipulation, Spinal mobilization, Scar mobilization, Vestibular training, Visual/preceptual remediation/compensation, DME instructions, Cryotherapy, and Moist heat.  All performed  as medically necessary.  All included unless contraindicated  PLAN FOR NEXT SESSION: Discharge to HEP   Ozell Silvan, PT, DPT, OCS, ATC 08/09/24  3:50 PM

## 2024-08-22 ENCOUNTER — Other Ambulatory Visit: Payer: Self-pay | Admitting: Medical Genetics

## 2024-10-15 ENCOUNTER — Encounter: Payer: Self-pay | Admitting: Radiology

## 2025-01-28 ENCOUNTER — Ambulatory Visit: Admitting: Orthopaedic Surgery
